# Patient Record
Sex: Female | Born: 1955 | Race: Black or African American | Hispanic: No | Smoking: Never smoker
Health system: Southern US, Community
[De-identification: ages and names within clinical notes are randomized; demographics above are authoritative.]

## PROBLEM LIST (undated history)

## (undated) DIAGNOSIS — R7303 Prediabetes: Secondary | ICD-10-CM

## (undated) DIAGNOSIS — E785 Hyperlipidemia, unspecified: Secondary | ICD-10-CM

## (undated) DIAGNOSIS — I10 Essential (primary) hypertension: Secondary | ICD-10-CM

## (undated) DIAGNOSIS — E119 Type 2 diabetes mellitus without complications: Secondary | ICD-10-CM

## (undated) DIAGNOSIS — J45909 Unspecified asthma, uncomplicated: Secondary | ICD-10-CM

## (undated) HISTORY — DX: Type 2 diabetes mellitus without complications: E11.9

## (undated) HISTORY — DX: Unspecified asthma, uncomplicated: J45.909

## (undated) HISTORY — DX: Hyperlipidemia, unspecified: E78.5

## (undated) HISTORY — PX: HERNIA REPAIR: SHX51

## (undated) HISTORY — PX: CHOLECYSTECTOMY: SHX55

---

## 2004-05-10 ENCOUNTER — Ambulatory Visit (HOSPITAL_COMMUNITY): Admission: RE | Admit: 2004-05-10 | Discharge: 2004-05-10 | Payer: Self-pay | Admitting: Family Medicine

## 2005-12-16 HISTORY — PX: DOPPLER ECHOCARDIOGRAPHY: SHX263

## 2006-05-28 ENCOUNTER — Inpatient Hospital Stay (HOSPITAL_COMMUNITY): Admission: AD | Admit: 2006-05-28 | Discharge: 2006-06-01 | Payer: Self-pay | Admitting: Internal Medicine

## 2006-05-29 ENCOUNTER — Ambulatory Visit: Payer: Self-pay | Admitting: *Deleted

## 2006-05-31 ENCOUNTER — Encounter (INDEPENDENT_AMBULATORY_CARE_PROVIDER_SITE_OTHER): Payer: Self-pay | Admitting: Specialist

## 2007-10-09 ENCOUNTER — Inpatient Hospital Stay (HOSPITAL_COMMUNITY): Admission: EM | Admit: 2007-10-09 | Discharge: 2007-10-10 | Payer: Self-pay | Admitting: Emergency Medicine

## 2008-09-22 IMAGING — CT CT PELVIS W/ CM
1 of 3 series · 14 of 32 positions shown, 19 images · IV contrast (Omnipaque 300)
Comparison: none

HISTORY: Nausea, vomiting, diarrhea, right lower quadrant pain, fever

[Series 2: abd_pel 5.0 b40s · axial · 0.87mm/px · z∈[-466,-50]mm · 14 of 95 slices shown, 19 images]
[im 6/95  soft-tissue]
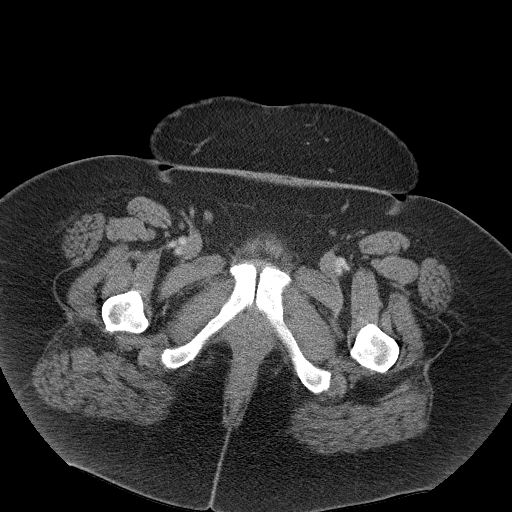
[im 6/95  bone]
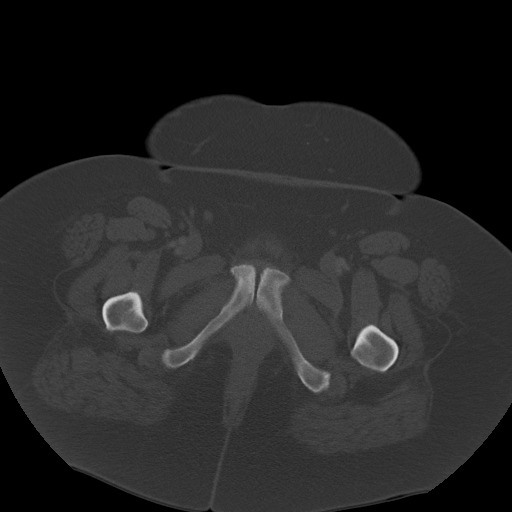
[im 12/95  soft-tissue]
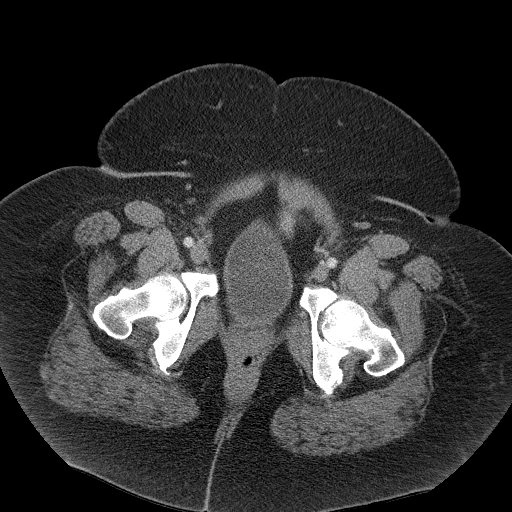
[im 23/95  soft-tissue]
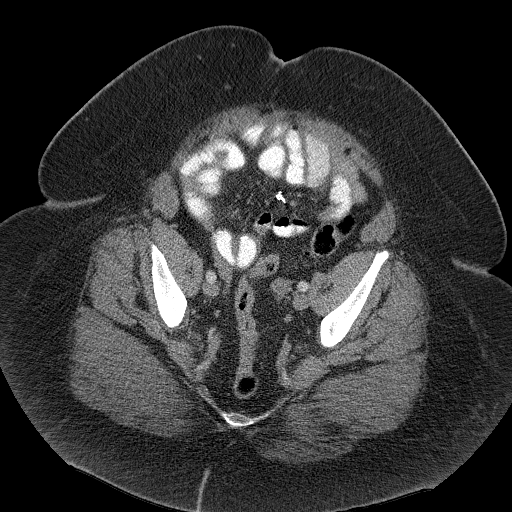
[im 28/95  soft-tissue]
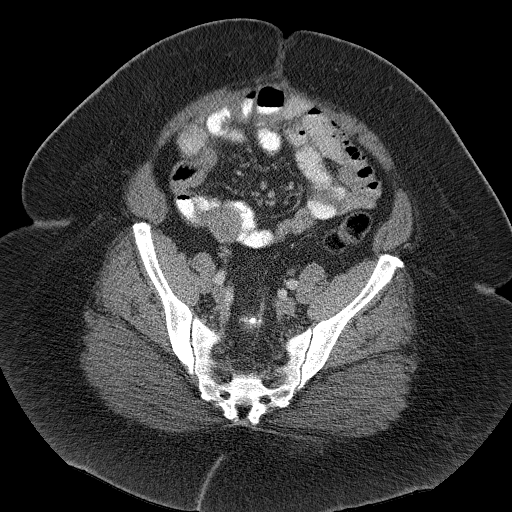
[im 34/95  soft-tissue]
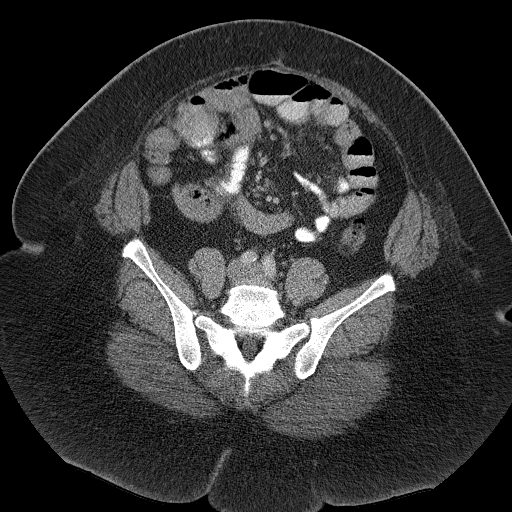
[im 39/95  soft-tissue]
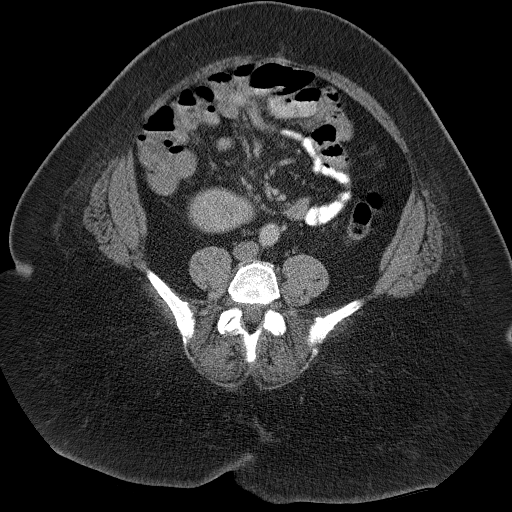
[im 50/95  soft-tissue]
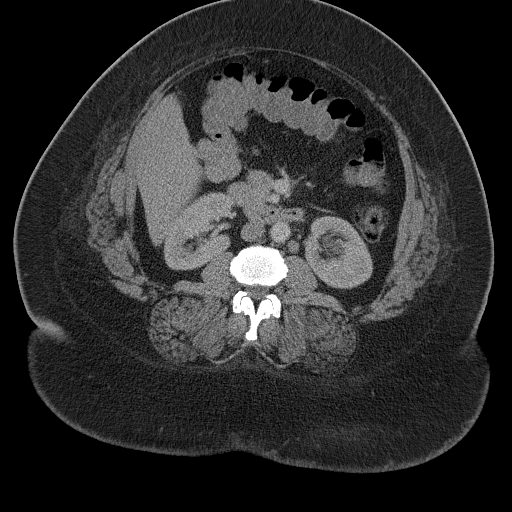
[im 56/95  soft-tissue]
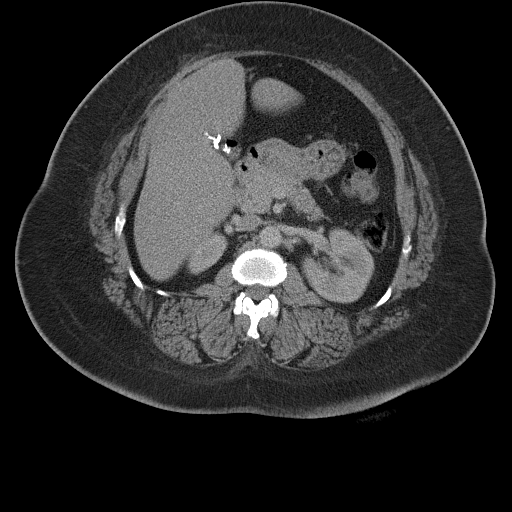
[im 61/95  soft-tissue]
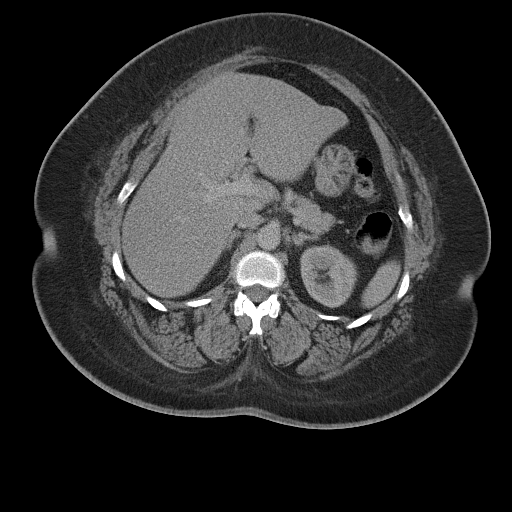
[im 61/95  bone]
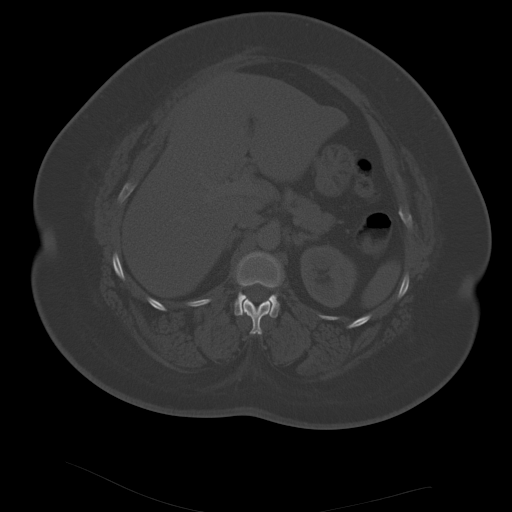
[im 67/95  soft-tissue]
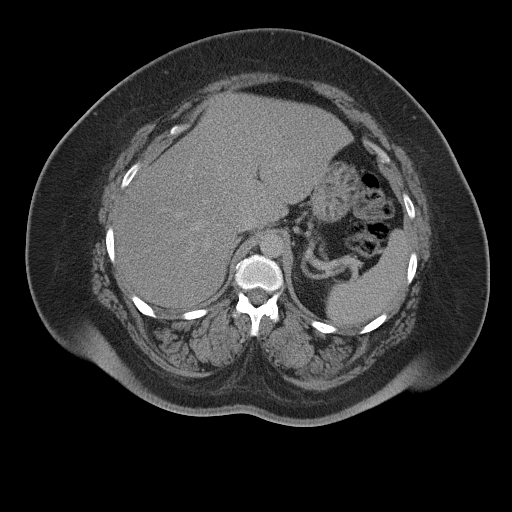
[im 72/95  soft-tissue]
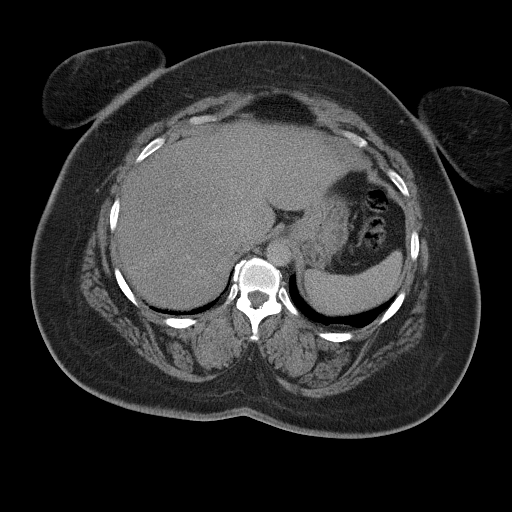
[im 72/95  lung]
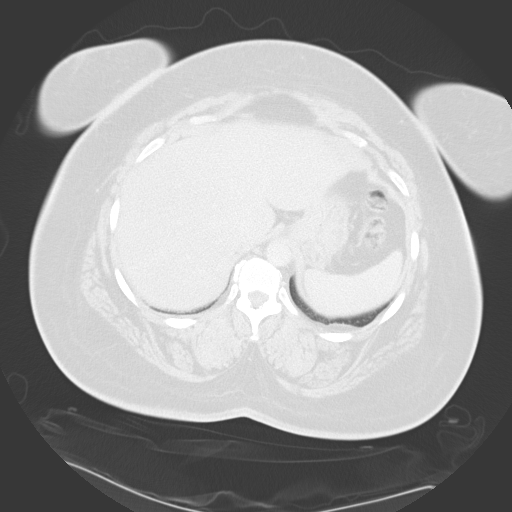
[im 78/95  lung]
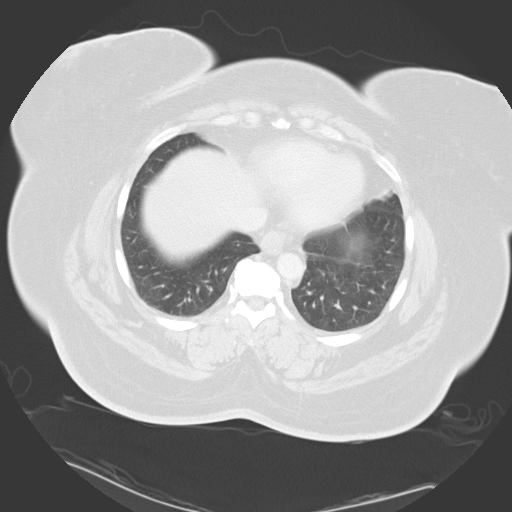
[im 83/95  soft-tissue]
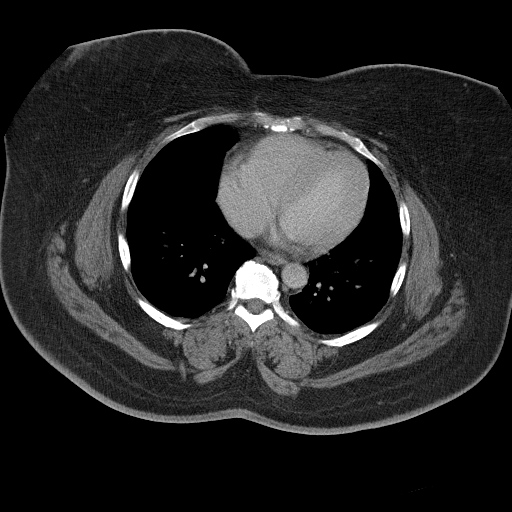
[im 83/95  lung]
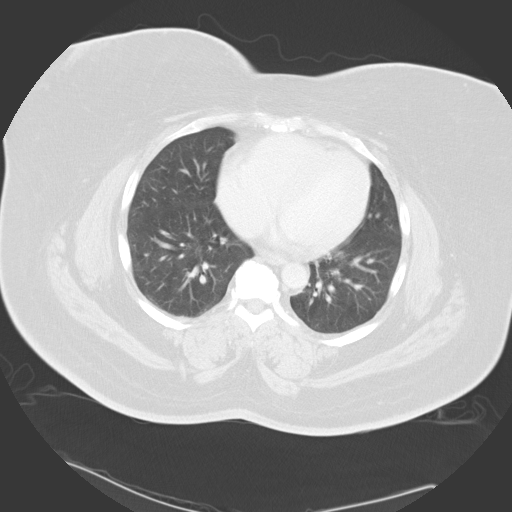
[im 89/95  soft-tissue]
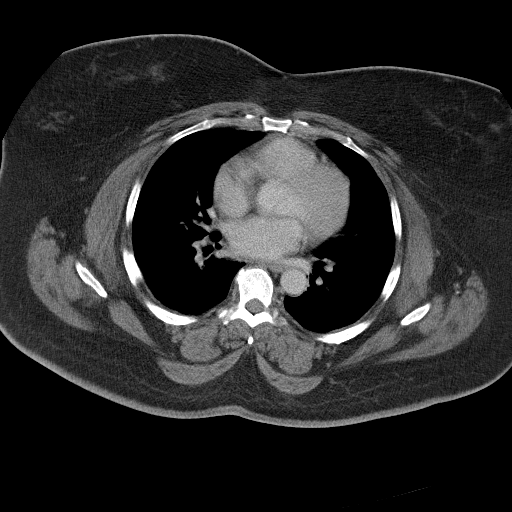
[im 89/95  lung]
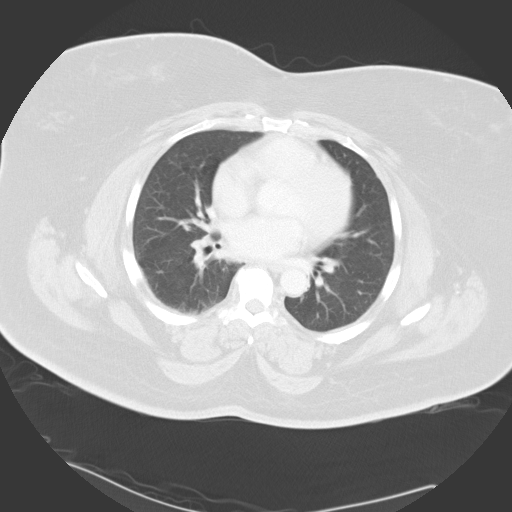

[14 of 32 positions shown; findings below may reference images not displayed]

CT ABDOMEN AND PELVIS WITH CONTRAST:

Multidetector helical CT imaging abdomen and pelvis performed.
Sagittal and coronal images are reconstructed from the axial data set.
Exam utilized dilute oral contrast and 120 cc Vmnipaque-2II.
Comparison CT abdomen 05/30/2006.

CT ABDOMEN:

Lung bases clear.
Tiny hiatal hernia.
Gallbladder surgically absent.
Liver, spleen, pancreas, and adrenal glands normal appearance.
Symmetric nephrograms with small bilateral renal cysts.
No definite hydronephrosis or ureteral dilatation.
Right kidney is slightly malrotated and ptotic.
Abnormal relationship of SMA and SMV suggesting small bowel malrotation.
No definite bowel obstruction identified.
Oral contrast has not progressed into distal small bowel and colon.
IMPRESSION: Suspect small bowel malrotation though no bowel obstruction or upper abdominal
acute process identified.
Small renal cysts.

CT PELVIS:

Normal appearance of uterus and adnexae.
Normal appendix.
Few small bowel loops in the pelvis show questionable minimal bowel wall
thickening, nonspecific.
No inflammatory changes are seen within the pelvis.
No mass, adenopathy, or free fluid.
Colon opacified but otherwise unremarkable.
No bone lesions.
IMPRESSION: Question minimal bowel wall thickening of several small bowel loops in pelvis,
nonspecific, could be seen as an artifact from underdistention, infectious
process/enteritis, inflammatory bowel disease.
No other pelvic abnormalities identified.

## 2009-06-12 ENCOUNTER — Ambulatory Visit (HOSPITAL_COMMUNITY): Admission: RE | Admit: 2009-06-12 | Discharge: 2009-06-12 | Payer: Self-pay | Admitting: Family Medicine

## 2011-02-18 ENCOUNTER — Emergency Department (HOSPITAL_COMMUNITY)
Admission: EM | Admit: 2011-02-18 | Discharge: 2011-02-18 | Disposition: A | Discharge status: Home / Self Care | Payer: Self-pay | Attending: Emergency Medicine | Admitting: Emergency Medicine

## 2011-02-18 ENCOUNTER — Emergency Department (HOSPITAL_COMMUNITY): Payer: Self-pay

## 2011-02-18 DIAGNOSIS — I10 Essential (primary) hypertension: Secondary | ICD-10-CM | POA: Insufficient documentation

## 2011-02-18 DIAGNOSIS — E119 Type 2 diabetes mellitus without complications: Secondary | ICD-10-CM | POA: Insufficient documentation

## 2011-02-18 DIAGNOSIS — R071 Chest pain on breathing: Secondary | ICD-10-CM | POA: Insufficient documentation

## 2011-02-18 DIAGNOSIS — R079 Chest pain, unspecified: Secondary | ICD-10-CM | POA: Insufficient documentation

## 2011-02-18 DIAGNOSIS — R059 Cough, unspecified: Secondary | ICD-10-CM | POA: Insufficient documentation

## 2011-02-18 DIAGNOSIS — R05 Cough: Secondary | ICD-10-CM | POA: Insufficient documentation

## 2011-02-18 LAB — POCT CARDIAC MARKERS
CKMB, poc: 1.1 ng/mL (ref 1.0–8.0)
CKMB, poc: <1 ng/mL — ABNORMAL LOW (ref 1.0–8.0)
Myoglobin, poc: 82.1 ng/mL (ref 12–200)
Myoglobin, poc: 55.9 ng/mL (ref 12–200)
Troponin i, poc: <0.05 ng/mL (ref 0.00–0.09)
Troponin i, poc: <0.05 ng/mL (ref 0.00–0.09)

## 2011-02-18 LAB — CBC
Hemoglobin: 13.3 g/dL (ref 12.0–15.0)
MCH: 32.5 pg (ref 26.0–34.0)
MCHC: 32.8 g/dL (ref 30.0–36.0)
MCV: 99 fL (ref 78.0–100.0)
Platelets: 308 10*3/uL (ref 150–400)

## 2011-02-18 LAB — BASIC METABOLIC PANEL
CO2: 30 mEq/L (ref 19–32)
GFR calc Af Amer: >60 mL/min (ref >60)
GFR calc non Af Amer: >60 mL/min (ref >60)
Sodium: 140 mEq/L (ref 135–145)

## 2011-02-18 LAB — DIFFERENTIAL
Basophils Absolute: 0 10*3/uL (ref 0.0–0.1)
Eosinophils Relative: 2 % (ref 0–5)
Lymphocytes Relative: 27 % (ref 12–46)
Lymphs Abs: 2.8 10*3/uL (ref 0.7–4.0)
Monocytes Absolute: 0.6 10*3/uL (ref 0.1–1.0)
Monocytes Relative: 6 % (ref 3–12)

## 2011-04-30 NOTE — Discharge Summary (Signed)
NAMEJUNETTE, Robin Durham            ACCOUNT NO.:  [id number]   MEDICAL RECORD NO.:  [id number]          PATIENT TYPE:  INP   LOCATION:  A325                          FACILITY:  [facility]   PHYSICIAN:  Marcello Moores, MD   DATE OF BIRTH:  05-Apr-1956   DATE OF ADMISSION:  10/09/2007  DATE OF DISCHARGE:  LH                               DISCHARGE SUMMARY   PRIMARY MEDICAL DOCTOR:  Dr. Nobie Putnam.   DISCHARGE DIAGNOSES:  1. Gastroenteritis, resolved overnight.  2. Hypertension, fairly controlled.  3. Morbid obesity.   HOME MEDICATIONS:  1. She will be discharged with Cipro 500 mg p.o. b.i.d. for 5 days.  2. Ziac 2.5 mg once a day.  3. Potassium chloride 10 mEq once a day.  4. Lasix 20 mg once a day.   HOSPITAL COURSE:  Ms. Soroka is a 55 year old female patient with  morbid obesity.  She has history of hypertension.  She came with  episodes of vomiting and diarrhea, and she was observed over night and  IV fluid given, and she received antibiotic Cipro and Flagyl, and  overnight her diarrhea and vomiting resolved, and the patient is asking  to go home, saying that she feels great, and  there is nothing, the  patient is asymptomatic and she is ready to go home now.   VITAL SIGNS:  Today temperature 97, pulse is 62 and respiratory rate 20.  Blood pressure is 145/82, saturation 96.  HEENT:  Pink conjunctivae, nonicteric sclera.  NECK:  Supple.  CHEST:  Good air entry.  CVS:  S1, S2 regular.  ABDOMEN:  Soft.  Normal bowel sounds.  No area of tenderness.  Obese.  EXTREMITIES:  No edema.  CNS:  Alert and well-oriented.   LABORATORY DATA:  Abdominal CAT scan, which was done yesterday, showed  small bowel malrotation, otherwise there was no other abnormality  identified, and there was a process of nonspecific thickening of several  small bowel loops which suggested enteritis or inflammatory bowel  disease.  Today, her labs white blood cells 9 and hemoglobin 12,  hematocrit 36, and  platelet count is 246.  On the chemistry sodium 142,  potassium 3.6 and chloride 107.  Bicarbonate is 28, glucose is 100 and  the BUN 6, creatinine 0.8.  Liver enzymes:  Total bilirubin is 1.1 and  alkaline phosphatase is 63, AST is 33, ALT is 40.  Urinanalysis showed  white blood cells 10-20, otherwise negative.   DISCHARGE PLAN:  The patient will be discharged today, to have followup  with her PMD in 2-3 days, and she was advised and given antibiotic to  take for 5 days, Cipro, and to come back if there is any problem to  emergency room at any time.  The patient was advised to consider  colonoscopy from her PMD.      Marcello Moores, MD  Electronically Signed     MT/MEDQ  D:  10/10/2007  T:  10/12/2007  Job:  [id number]   cc:   Patrica Duel, M.D.  Fax: [phone]

## 2011-04-30 NOTE — H&P (Signed)
NAMELATAISHA, COLAN            ACCOUNT NO.:  [id number]   MEDICAL RECORD NO.:  [id number]          PATIENT TYPE:  INP   LOCATION:  A325                          FACILITY:  [facility]   PHYSICIAN:  Osvaldo Shipper, MD     DATE OF BIRTH:  1956-10-12   DATE OF ADMISSION:  10/09/2007  DATE OF DISCHARGE:  LH                              HISTORY & PHYSICAL   PRIMARY MEDICAL DOCTOR:  Patrica Duel with [facility].   ADMITTING DIAGNOSES:  1. Abdominal pain likely secondary to gastroenteritis.  2. Morbid obesity.  3. Hypertension.   CHIEF COMPLAINT:  Nausea, vomiting, diarrhea and abdominal  pain since  yesterday.   HISTORY OF PRESENT ILLNESS:  The patient is a 55 year old African-  American female who is morbidly obese and who has a history of  hypertension, who was in her usual state of health, until yesterday when  she started having vomiting, of which she had about 6 episodes.  It was  bilious in color, no blood.  She also had two episodes of diarrhea which  was watery, yellowish, no blood as well.  She also started having  abdominal pain in the right side of the abdomen, which it did not  radiate anywhere but was about 6 to 8/10 in intensity.  No precipitant  factors, no aggravating or relieving factors.  No history of any weight  loss.  No history of any urinary discomfort.  She did mention low-grade  fever at home.  She mentioned she took antibiotics for a UTI more than a  month ago, but she did have some chicken dish at [employer] some time  yesterday.  This was before the onset of her symptoms.  No other travel  history.   MEDICATIONS AT HOME:  1. Ziac 2.5 mg once a day.  2. Potassium chloride 10 mEq once a day.  3. Lasix possibly 20 mg once a day.  She does not know the dose.   ALLERGIES:  NO KNOWN DRUG ALLERGIES.   PAST MEDICAL HISTORY:  Hypertension, leg swelling, obesity.   SURGICAL HISTORY:  Cholecystectomy and hernia repair.  She has never had  a  colonoscopy in the past.   SOCIAL HISTORY:  Lives in [place].  No smoking, alcohol or illicit  drug use.  She is independent with the ADLs.   FAMILY HISTORY:  Positive for hypertension.   REVIEW OF SYSTEMS:  GENERAL:  Positive for weakness.  HEENT:  Unremarkable.  CARDIOVASCULAR:  Unremarkable.  RESPIRATORY:  Unremarkable.  GI:  As in HPI.  GU:  Unremarkable.  MUSCULOSKELETAL:  Unremarkable.  NEUROLOGICAL:  Unremarkable.  PSYCHIATRIC:  Unremarkable.  DERMATOLOGICAL:  Unremarkable.   LABORATORY DATA:  Her hemoglobin is 11.7 with 97% neutrophils, no bands  reported.  Her white count is 11.7 with 91% neutrophils, hemoglobin  13.8, platelet count 284.  Her CMET shows a glucose of 121, bilirubin  1.3, AST 43, ALT 51, lipase 19, calcium 8.7, UA showed a specific  gravity more than 1.03, trace ketones, trace blood, negative nitrite,  leukocytes, 11-20 WBCs, rare bacteria, 3-6 RBCs.   IMAGING STUDIES:  She  had a CAT scan of her abdomen and pelvis with  contrast, which showed suspect small bowel malrotation with no  obstruction, and CT pelvis showed a question of minimal bowel wall  thickening of several small bowel loops and pelvis which is nonspecific.   ASSESSMENT:  This is a 55 year old African-American female, who presents  with vomiting, nausea, a couple episodes of diarrhea and abdominal pain  starting yesterday.  These symptoms started after she ate at [employer].  This is likely food-borne illness.  She possibly has gastroenteritis.  Also, I do not suspect any inflammatory bowel disease at this time.   PLAN:  1. Gastroenteritis, possibly secondary to food-borne pathogen.      Observe in the hospital.  Put her on Cipro and Flagyl.  Keep her      NPO for now and advance as her symptoms improve.  Blood cultures      will be sent.  Stool with C. diff and other investigations will      also be sent, if she has more diarrhea, though the likelihood of      this being C. diff is  pretty low.  2. History of hypertension.  Continue Ziac.  Hold off on the Lasix for      now.  3. Mildly abnormal LFTs.  Just keep a watch on them.  I will re-check      them in the morning.  They could be related to fatty liver.  She is      status post cholecystectomy in the past.   DVT/GI prophylaxis will be initiated.   Anticipate patient improving rather quickly, and then she can probably  go home in a day or two.  If she is still here, and if she has not  improved, GI will be consulted on Monday.  GI is not available this  weekend in this hospital.      Osvaldo Shipper, MD  Electronically Signed     GK/MEDQ  D:  10/09/2007  T:  10/10/2007  Job:  [id number]   cc:   Patrica Duel, M.D.  Fax: [phone]

## 2011-05-03 NOTE — Discharge Summary (Signed)
NAMEMONTANA, BRYNGELSON            ACCOUNT NO.:  [id number]   MEDICAL RECORD NO.:  [id number]          PATIENT TYPE:  INP   LOCATION:  A204                          FACILITY:  [facility]   PHYSICIAN:  Madelin Rear. Sherwood Gambler, MD  DATE OF BIRTH:  17-Dec-1955   DATE OF ADMISSION:  05/28/2006  DATE OF DISCHARGE:  06/17/2007LH                                 DISCHARGE SUMMARY   DISCHARGE DIAGNOSES:  1.  Cholelithiasis and cholecystitis.  2.  Urinary tract infection.  3.  Morbid obesity.  4.  Hypertension.  5.  Gastroesophageal reflux disease and positive Helicobacter pylori      infection.   INPATIENT PROCEDURE:  Cholecystectomy.   MEDICATIONS:  1.  Demodex 10 mg b.i.d. .  2.  Hydralazine 10 mg p.o. daily.   SUMMARY:  Patient was admitted with chest pain waxing and waning for three  days prior to admission.  There was some notable increase in pain with lying  flat.  Her exam was unrevealing on admission.  She was admitted for rule out  of cardiac etiology with serial enzymes.  She was seen in consultation by  cardiology and felt to possibly have a gallbladder mass.  In either case,  she subsequently underwent cholecystectomy by Dr. Katrinka Blazing with no  complications.  Was discharged stable for followup in surgery's office as  well as our office.      Madelin Rear. Sherwood Gambler, MD  Electronically Signed     LJF/MEDQ  D:  06/19/2006  T:  06/19/2006  Job:  [id number]

## 2011-05-03 NOTE — Procedures (Signed)
Robin Durham, Robin Durham            ACCOUNT NO.:  [id number]   MEDICAL RECORD NO.:  [id number]          PATIENT TYPE:  INP   LOCATION:  A204                          FACILITY:  [facility]   PHYSICIAN:  [place] Bing, M.D. [facility] OF BIRTH:  May 12, 1956   DATE OF PROCEDURE:  05/30/2006  DATE OF DISCHARGE:                                  ECHOCARDIOGRAM   PROCEDURE:  Echocardiogram.   CLINICAL DATA:  A 55 year old woman with chest pain.  M-mode aorta 2.9, left  atrium 4.1, septum 1.4, posterior wall 1.6, LV diastole 5.3, LV systole 3.9.   1.  Technically difficult and limited echocardiographic study.  2.  Left atrial size is at the upper limit of normal; right atrial size not      well defined, but is probably normal.  The right ventricle is normal in      size and function.  3.  The aortic root is normal in diameter; calcification of the wall is      present.  The aortic valve is not adequately imaged, but no gross      abnormalities are appreciated.  4.  Poor imaging of the pulmonic valve and proximal pulmonary artery, which      appear normal.  5.  Normal mitral valve; mild to moderate annular calcification.  6.  Tricuspid valve not adequately imaged.  7.  Normal left ventricular size; not all myocardial segments well seen.      Left ventricular hypertrophy appears to be present.  Regional and global      LV function appear hyperdynamic.      [place] Bing, M.D. [facility]  Electronically Signed     RR/MEDQ  D:  05/30/2006  T:  05/30/2006  Job:  [id number]

## 2011-05-03 NOTE — Consult Note (Signed)
Robin Durham, Robin Durham            ACCOUNT NO.:  [id number]   MEDICAL RECORD NO.:  [id number]          PATIENT TYPE:  INP   LOCATION:  A204                          FACILITY:  [facility]   PHYSICIAN:  Vida Roller, M.D.   DATE OF BIRTH:  1956-06-03   DATE OF CONSULTATION:  05/29/2006  DATE OF DISCHARGE:                                   CONSULTATION   CARDIOLOGY CONSULTATION   HISTORY OF PRESENT ILLNESS:  Robin Durham is a 55 year old woman with  past medical history significant for hypertension who presented to her  primary physician's office complaining of chest discomfort.  It is  substernal discomfort in the center of her chest which started on Tuesday  evening when she was lying in bed.  It got worse throughout the night and  into the next morning.  She was admitted to [facility] yesterday  and the discomfort resolved some time after that.  She states that she has  had some nausea and vomiting and no shortness of breath, some hot flushing  sensation. She had recently manipulated her diet, completely off of meat to  do just vegetables and then started to eat meat again after about 21 days,  the day prior to her discomfort.  She denies any paroxysmal nocturnal  dyspnea, orthopnea, no syncope, no palpitations.   PAST MEDICAL HISTORY:  Significant for hypertension, an umbilical hernia  repair, morbid obesity.   CURRENT MEDICATIONS:  1.  Aspirin 325 mg once a day.  2.  Ziac, two pills once a day.  3.  Protonix 40 mg twice a day.  4.  Potassium 20 mEq twice a day.  5.  Demodex 10 mg twice a day.  6.  Catapres 0.2 mg q.i.d. p.r.n. hypertension.  7.  Hydralazine 10 mg intravenously q.4h. p.r.n. hypertension.  8.  Intravenous normal saline at 10 cc per hour.   SOCIAL HISTORY:  She lives in [place] with her children.  She is a widow.  She has two children.  She does not smoke, drink or use illicit drugs.   FAMILY HISTORY:  Her mother died of a brain aneurysm at age 20.   Her father  died of cerebrovascular accident at age 35. She has one brother who died if  a heart attack at age 19 and she lost one brother to pneumonia on Monday.   PHYSICAL EXAMINATION:  VITAL SIGNS:  She is 321 pounds.  Her blood pressure  is 149/91, pulse is 73, respirations 16, she is afebrile.  HEENT:  Examination of the head, ears, eyes, nose and throat is  unremarkable.  NECK:  The neck is full.  There is no obvious jugular venous distention or  carotid bruits.  CHEST:  Her chest has decreased breath sounds at the bases but otherwise is  clear.  CARDIOVASCULAR:  Regular but distant, no obvious murmur is noted.  PMI is  not palpable.  SKIN:  Is without significant rashes.  GENITOURINARY, BREAST, RECTAL:  Examination's are deferred.  ABDOMEN:  Obese but soft.  There is no tenderness.  EXTREMITIES: Have trace edema, 1+ pulses, no bruits.  MUSCULOSKELETAL:  Nonfocal.  NEUROLOGICAL:  Nonfocal.   CLINICAL DATA:  The patient has had a HIDA scan which shows a depressed  gallbladder ejection fraction.  She has had an abdominal ultrasound which  shows a questionable mass in her gallbladder.  She has had abdominal and  chest x-ray which shows constipation and diffuse peribronchial changes but  no acute changes.  An electrocardiogram shows sinus rhythm at a rate of 70  with normal interval, no axes with no ischemic ST-T wave changes.  No Q  waves.  It is a poor quality tracing, however.   LABORATORY DATA:  White blood cell count 14,000.  Hemoglobin and hematocrit  14 and 42.  Platelet count 360,000.  Sodium 143, potassium 4.2, chloride  101, bicarb 32, BUN 14, creatinine 1.0, blood sugar 137, non fasting.  Liver  function tests appear to be relatively normal.  Cardiac enzymes x2 are  negative.  D-dimer is 0.22.  H. Pylori is elevated at 6.0 and she has  evidence of a urinary tract infection on urinalysis.   IMPRESSION:  This is a morbidly obese female with minimal cardiac risk   factors who has atypical chest discomfort with negative cardiac enzymes and  no obvious ischemia on her electrocardiogram after a prolonged episode of  discomfort.  She has what appears to be an abnormal ultrasound with question  of gallbladder either a stone or a mass, it is not clear.  In reviewing the  abdominal ultrasound with the radiologist, he is not convinced it is a big  stone; he thinks it is possible it could be a polyp or some other  abnormality.  She has minimal cardiac risk factors and only really  hypertension which is moderately poorly controlled.  I think it is probably  reasonable to continue to cycle her enzymes.  If she has three sets that are  negative, I would also check her echocardiogram and if that shows  normal  left ventricular systolic function then I would repeat her  electrocardiogram, hopefully this will show no acute ischemia, in which case  I probably would just follow her along and not do anything particularly  provocative as we really don't have any evidence for coronary disease here  and she can complete her work up as an outpatient.  With the recent HIDA  scan, we are unable to do a perfusion scan with technetium for another 24  hours; as such, because she is not having any active issues, I think it is  probably reasonable just to follow her and that is what I would do.      Vida Roller, M.D.  Electronically Signed     JH/MEDQ  D:  05/29/2006  T:  05/29/2006  Job:  [id number]

## 2011-05-03 NOTE — H&P (Signed)
NAMECALIEGH, MIDDLEKAUFF            ACCOUNT NO.:  [id number]   MEDICAL RECORD NO.:  [id number]          PATIENT TYPE:  INP   LOCATION:  A204                          FACILITY:  [facility]   PHYSICIAN:  Madelin Rear. Sherwood Gambler, MD  DATE OF BIRTH:  October 06, 1956   DATE OF ADMISSION:  DATE OF DISCHARGE:  LH                                HISTORY & PHYSICAL   CHIEF COMPLAINT:  Chest pain.   HISTORY OF PRESENT ILLNESS:  The patient has had waxing and waning chest  pain for 3 days pretty much continuously.  She states it does get worse when  she lies recumbent.  She was admitted with some shortness of breath, as  well.  She denies exertional precipitants, although she is uncertain of  this.  She did have a couple of cold sweats and lightheaded spells; however,  she denies any palpitations or true syncope.  There has been no nausea,  vomiting or diarrhea.  No hematemesis, hematochezia or melena.  She denies  true reflux symptomatology.  She had no fevers, rigors or chills.   PAST MEDICAL HISTORY:  1.  Hypertension.  2.  Peripheral edema   MEDICATIONS:  Maintained on:  1.  Demadex.  2.  Potassium.  3.  Ziac.  4.  Lodine p.r.n.   SOCIAL HISTORY:  Nonsmoker, nondrinker.  No drug use.  She is a widow as of  3 years ago and has 4 children.   FAMILY HISTORY:  Positive cerebrovascular accident in her father in his 1s,  and mother deceased around age 39 from a brain aneurysm.  One sibling drug-  eluding stent have coronary artery disease at age 18, namely her brother.   REVIEW OF SYSTEMS:  As above.  She did admit to some increasing pedal edema  associated with some shortness of breath.   PHYSICAL EXAMINATION:  GENERAL:  Morbidly obese, in no acute distress.  HEENT:  No JVD or adenopathy.  NECK:  Supple.  CHEST:  Clear.  CARDIAC:  Regular rhythm.  No murmur, gallop, or rub.  ABDOMEN:  Soft.  No organomegaly or masses.  EXTREMITIES:  Trace bipedal edema.  NEUROLOGIC:  Nonfocal.   Electrocardiogram in the office revealed AC current artifact; however, there  is slight flattening of V5 and V6, as well as in lead I and II.  Overall,  these are nonspecific ST-T wave changes without any acute ischemia or  infarction identified.   IMPRESSION:  Chest pain, uncertain etiology.  Clearly cardiac etiology is  the most worrisome.  She will be admitted for telemetry monitoring, stat and  serial enzymes.  Her blood pressure is also moderately elevated.  It has  been running up a little bit to 179-180 systolic/95 roughly.  In the office,  she was 150/98.  Due to this hypertension, we will increase her  antihypertensive regimen.  We will observe and use short-acting medications  as needed to maintain good blood pressure control.  Will start her on proton  pump inhibitor therapy, as well as oral aspirin.  Cardiology consultation  will be sought.  At the time of transfer to the  hospital, she is stable.      Madelin Rear. Sherwood Gambler, MD  Electronically Signed     LJF/MEDQ  D:  05/28/2006  T:  05/28/2006  Job:  [id number]

## 2011-05-03 NOTE — Op Note (Signed)
Robin Durham, Robin Durham            ACCOUNT NO.:  [id number]   MEDICAL RECORD NO.:  [id number]          PATIENT TYPE:  INP   LOCATION:  A204                          FACILITY:  [facility]   PHYSICIAN:  Dirk Dress. Katrinka Blazing, M.D.   DATE OF BIRTH:  08-May-1956   DATE OF PROCEDURE:  DATE OF DISCHARGE:                                 OPERATIVE REPORT   PREOPERATIVE DIAGNOSIS:  Cholecystitis with gallbladder mass.   POSTOPERATIVE DIAGNOSIS:  Cholecystitis, cholelithiasis.   PROCEDURE:  Open cholecystectomy.   SURGEON:  Dirk Dress. Katrinka Blazing, M.D.   INDICATIONS:  The patient is a 55 year old female who presented with chest  and upper abdominal pain. She was evaluated for cardiac disease and this was  ruled out.  She was found to have a large mass of her gallbladder which had  some increased vascular flow suggesting a soft tissue mass of the  gallbladder.  The mass was about 4 cm.  The patient is still symptomatic and  she is scheduled for open cholecystectomy, more because of the fact that she  has had intestinal resection and hernia repair and the upper abdomen is  filled with adhesions.   DESCRIPTION OF PROCEDURE:  Under general anesthesia the patient's abdomen  was prepped and draped in the sterile field.  Right subcostal incision was  made and extended through the muscle into the peritoneal cavity.  The upper  abdomen was explored.  The gallbladder was intensely dilated and the wall  was slightly thickened, however, the mass appeared to be a very large hard  stone that filled the lower one third of the gallbladder. After the abdomen  was adequately packed off, it was separated from the intrahepatic band with  electrocautery.  Vessels were isolated, clipped with hemoclips and divided.  Dissection was continued down to the cystic duct.  The cystic duct was  clamped with right angle clamp close to the gallbladder and divided.  Gallbladder was passed off.  The mass appeared to be a large stone.  The  cystic duct was tied with two ligatures of 2-0 silk and then doubly clipped.  Irrigation was carried out.  No other abnormality was not noted.  The bed of  the gallbladder appeared to be normal.  There was no induration in this  area.  Sponge counts were verified as correct x2. The abdomen was closed  using 0  Prolene on both anterior and posterior rectus sheaths, 2-0 Monocryl in the  subcutaneous fat and staples on the skin.  Dressings were placed.  She was  awakened from anesthesia uneventfully, transferred to a bed and taken to the  postanesthesia care unit in satisfactory condition.      Dirk Dress. Katrinka Blazing, M.D.  Electronically Signed     LCS/MEDQ  D:  05/31/2006  T:  05/31/2006  Job:  [id number]   cc:   Madelin Rear. Sherwood Gambler, MD  Fax: [phone]

## 2011-09-25 LAB — HEPATIC FUNCTION PANEL
ALT: 51 — ABNORMAL HIGH
AST: 43 — ABNORMAL HIGH
Albumin: 3.7
Total Protein: 6.8

## 2011-09-25 LAB — BASIC METABOLIC PANEL
CO2: 27
Chloride: 103
GFR calc Af Amer: >60
Potassium: 3.6
Sodium: 135

## 2011-09-25 LAB — COMPREHENSIVE METABOLIC PANEL
ALT: 43 — ABNORMAL HIGH
Albumin: 3.2 — ABNORMAL LOW
Alkaline Phosphatase: 63
Potassium: 3.6
Sodium: 142
Total Protein: 5.9 — ABNORMAL LOW

## 2011-09-25 LAB — DIFFERENTIAL
Basophils Absolute: 0
Basophils Relative: 0
Basophils Relative: 0
Eosinophils Absolute: 0.1
Lymphocytes Relative: 6 — ABNORMAL LOW
Lymphs Abs: 0.7
Monocytes Absolute: 0.7
Monocytes Relative: 8
Neutro Abs: 6.7
Neutrophils Relative %: 91 — ABNORMAL HIGH

## 2011-09-25 LAB — URINALYSIS, ROUTINE W REFLEX MICROSCOPIC
Glucose, UA: 100 — AB
Protein, ur: >300 — AB
pH: 5.5

## 2011-09-25 LAB — CBC
HCT: 36
HCT: 41.3
Hemoglobin: 12
MCHC: 33.3
MCV: 99.1
Platelets: 246
RBC: 3.61 — ABNORMAL LOW
RBC: 4.17
WBC: 11.7 — ABNORMAL HIGH
WBC: 9

## 2011-09-25 LAB — CULTURE, BLOOD (ROUTINE X 2)

## 2011-09-25 LAB — URINE MICROSCOPIC-ADD ON

## 2011-09-25 LAB — LIPASE, BLOOD: Lipase: 19

## 2012-02-15 ENCOUNTER — Other Ambulatory Visit (HOSPITAL_COMMUNITY): Payer: Self-pay | Admitting: Family Medicine

## 2012-02-15 ENCOUNTER — Ambulatory Visit (HOSPITAL_COMMUNITY)
Admission: RE | Admit: 2012-02-15 | Discharge: 2012-02-15 | Disposition: A | Discharge status: Home / Self Care | Payer: Self-pay | Source: Ambulatory Visit | Attending: Family Medicine | Admitting: Family Medicine

## 2012-02-15 DIAGNOSIS — R0989 Other specified symptoms and signs involving the circulatory and respiratory systems: Secondary | ICD-10-CM | POA: Insufficient documentation

## 2012-02-15 DIAGNOSIS — I517 Cardiomegaly: Secondary | ICD-10-CM | POA: Insufficient documentation

## 2012-02-15 DIAGNOSIS — R059 Cough, unspecified: Secondary | ICD-10-CM | POA: Insufficient documentation

## 2012-02-15 DIAGNOSIS — R05 Cough: Secondary | ICD-10-CM | POA: Insufficient documentation

## 2012-02-27 ENCOUNTER — Ambulatory Visit (HOSPITAL_COMMUNITY)
Admission: RE | Admit: 2012-02-27 | Discharge: 2012-02-27 | Disposition: A | Discharge status: Home / Self Care | Payer: Self-pay | Source: Ambulatory Visit | Attending: Cardiovascular Disease | Admitting: Cardiovascular Disease

## 2012-02-27 DIAGNOSIS — I10 Essential (primary) hypertension: Secondary | ICD-10-CM | POA: Insufficient documentation

## 2012-02-27 DIAGNOSIS — I517 Cardiomegaly: Secondary | ICD-10-CM | POA: Insufficient documentation

## 2012-02-27 DIAGNOSIS — Z8249 Family history of ischemic heart disease and other diseases of the circulatory system: Secondary | ICD-10-CM | POA: Insufficient documentation

## 2012-02-27 HISTORY — PX: DOPPLER ECHOCARDIOGRAPHY: SHX263

## 2012-02-27 NOTE — Progress Notes (Signed)
*  PRELIMINARY RESULTS* Echocardiogram 2D Echocardiogram has been performed.  Conrad  02/27/2012, 1:17 PM

## 2012-10-12 ENCOUNTER — Emergency Department (HOSPITAL_COMMUNITY): Payer: Self-pay

## 2012-10-12 ENCOUNTER — Encounter (HOSPITAL_COMMUNITY): Payer: Self-pay | Admitting: *Deleted

## 2012-10-12 ENCOUNTER — Emergency Department (HOSPITAL_COMMUNITY)
Admission: EM | Admit: 2012-10-12 | Discharge: 2012-10-12 | Disposition: A | Discharge status: Home / Self Care | Payer: Self-pay | Attending: Emergency Medicine | Admitting: Emergency Medicine

## 2012-10-12 DIAGNOSIS — I10 Essential (primary) hypertension: Secondary | ICD-10-CM | POA: Insufficient documentation

## 2012-10-12 DIAGNOSIS — Z79899 Other long term (current) drug therapy: Secondary | ICD-10-CM | POA: Insufficient documentation

## 2012-10-12 DIAGNOSIS — J029 Acute pharyngitis, unspecified: Secondary | ICD-10-CM | POA: Insufficient documentation

## 2012-10-12 DIAGNOSIS — R05 Cough: Secondary | ICD-10-CM | POA: Insufficient documentation

## 2012-10-12 DIAGNOSIS — J069 Acute upper respiratory infection, unspecified: Secondary | ICD-10-CM | POA: Insufficient documentation

## 2012-10-12 DIAGNOSIS — R059 Cough, unspecified: Secondary | ICD-10-CM | POA: Insufficient documentation

## 2012-10-12 HISTORY — DX: Prediabetes: R73.03

## 2012-10-12 HISTORY — DX: Essential (primary) hypertension: I10

## 2012-10-12 LAB — GLUCOSE, CAPILLARY: Glucose-Capillary: 99 mg/dL (ref 70–99)

## 2012-10-12 MED ORDER — OXYCODONE-ACETAMINOPHEN 5-325 MG PO TABS: 1.0000 | ORAL_TABLET | ORAL | Status: AC | PRN

## 2012-10-12 MED ORDER — DEXAMETHASONE 10 MG/ML FOR PEDIATRIC ORAL USE
10.0000 mg | Freq: Once | INTRAMUSCULAR | Status: AC
  Administered 2012-10-12: 10 mg via ORAL
  Filled 2012-10-12: qty 1

## 2012-10-12 NOTE — ED Provider Notes (Signed)
History   This chart was scribed for Robin Booze, MD by Gerlean Ren. This patient was seen in room [facility]16A/[facility]16A and the patient's care was started at 14:29.   CSN: [id number]  Arrival date & time 10/12/12  1304   First MD Initiated Contact with Patient 10/12/12 1427      Chief Complaint  Patient presents with  . Otalgia    (Consider location/radiation/quality/duration/timing/severity/associated sxs/prior treatment) The history is provided by the patient. No language interpreter was used.   [name] is a 56 y.o. female who presents to the Emergency Department complaining of 3 days of constant, gradually worsening bilateral otalgia worsened by swallowing with associated sore throat, HA, green phlegm-producing cough, chills, sweats, clear rhinorrhea, and myalgias.  Pain is mildly improved by tylenol.  Pt denies fever and any known sick contacts.  Pt has not received flu-shot this year.  Pt has h/o HTN and borderline DM.  Pt denies tobacco and alcohol use.  PCP is Dr. Phillips Odor at [facility] Past Medical History  Diagnosis Date  . Hypertension   . Borderline diabetic     Past Surgical History  Procedure Date  . Hernia repair   . Cholecystectomy     History reviewed. No pertinent family history.  History  Substance Use Topics  . Smoking status: Never Smoker   . Smokeless tobacco: Not on file  . Alcohol Use: No    No OB history provided.  Review of Systems  All other systems reviewed and are negative.    Allergies  Review of patient's allergies indicates no known allergies.  Home Medications   Current Outpatient Rx  Name Route Sig Dispense Refill  . ACETAMINOPHEN 500 MG PO TABS Oral Take 1,000 mg by mouth every 6 (six) hours as needed. Pain    . AMLODIPINE BESYLATE 5 MG PO TABS Oral Take 5 mg by mouth daily.    Marland Kitchen LISINOPRIL 20 MG PO TABS Oral Take 20 mg by mouth daily.    Marland Kitchen METFORMIN HCL 500 MG PO TABS Oral Take 500 mg by mouth daily.    Marland Kitchen POTASSIUM  CHLORIDE ER 10 MEQ PO TBCR Oral Take 10 mEq by mouth daily.    . TORSEMIDE 20 MG PO TABS Oral Take 20 mg by mouth daily.      BP 169/81  Pulse 80  Temp 98 F (36.7 C) (Oral)  Resp 20  Ht 5\' 3"  (1.6 m)  Wt 324 lb (146.965 kg)  BMI 57.39 kg/m2  SpO2 100%  Physical Exam  Nursing note and vitals reviewed. Constitutional: She is oriented to person, place, and time. She appears well-developed and well-nourished.       Obese.  HENT:  Head: Normocephalic and atraumatic.       Mild erythema of the pharynx.  Mild edema of the uvula.    Eyes: EOM are normal.  Neck: Normal range of motion. No tracheal deviation present.  Cardiovascular: Normal rate, regular rhythm and normal heart sounds.   No murmur heard. Pulmonary/Chest: Effort normal and breath sounds normal. She has no wheezes.  Abdominal: Soft. Bowel sounds are normal. There is no tenderness.  Musculoskeletal: Normal range of motion. She exhibits no edema.  Neurological: She is alert and oriented to person, place, and time.  Skin: Skin is warm.  Psychiatric: She has a normal mood and affect.    ED Course  Procedures (including critical care time) DIAGNOSTIC STUDIES: Oxygen Saturation is 100% on room air, normal by my interpretation.  COORDINATION OF CARE: 14:35- Patient informed of clinical course, understands medical decision-making process, and agrees with plan.  Ordered CBG monitoring, rapid strep screen, and chest XR.     Results for orders placed during the hospital encounter of 10/12/12  RAPID STREP SCREEN      Component Value Range   Streptococcus, Group A Screen (Direct) NEGATIVE  NEGATIVE  GLUCOSE, CAPILLARY      Component Value Range   Glucose-Capillary 99  70 - 99 mg/dL   Dg Chest 2 View  40/98/1191  *RADIOLOGY REPORT*  Clinical Data: Productive cough, sore throat and otalgia.  CHEST - 2 VIEW  Comparison: 02/15/2012  Findings: Slightly more prominent cardiomegaly without evidence of overt pulmonary edema,  airspace consolidation or pleural fluid. Stable tortuosity of the thoracic aorta.  IMPRESSION: Slightly more prominent cardiomegaly since the prior chest x-ray. No overt edema or focal infiltrate is identified.   Original Report Authenticated By: Reola Calkins, M.D.       1. Upper respiratory infection   2. Pharyngitis       MDM  Upper respiratory infection with pharyngitis. Her pain appears to be referred. She is diabetic, so CBG will be checked to see if she would be able to tolerate a steroid dose.  Strep screen is negative and chest x-ray shows no evidence of pneumonia. CBG is actually normal at 99. She's given a dose of dexamethasone and sent home with prescription for Percocet for pain. She is to followup with her PCP if she is not starting to show improvement in the next several days.  I personally performed the services described in this documentation, which was scribed in my presence. The recorded information has been reviewed and considered.           Robin Booze, MD 10/12/12 [phone]

## 2012-10-12 NOTE — ED Notes (Signed)
bil earache, sore throat, and headache since Saturday.

## 2014-03-22 ENCOUNTER — Other Ambulatory Visit (HOSPITAL_COMMUNITY): Payer: Self-pay | Admitting: Family Medicine

## 2014-03-22 ENCOUNTER — Ambulatory Visit (HOSPITAL_COMMUNITY)
Admission: RE | Admit: 2014-03-22 | Discharge: 2014-03-22 | Disposition: A | Discharge status: Home / Self Care | Payer: BC Managed Care – PPO | Source: Ambulatory Visit | Attending: Family Medicine | Admitting: Family Medicine

## 2014-03-22 DIAGNOSIS — R0602 Shortness of breath: Secondary | ICD-10-CM | POA: Insufficient documentation

## 2014-03-22 DIAGNOSIS — R059 Cough, unspecified: Secondary | ICD-10-CM

## 2014-03-22 DIAGNOSIS — R918 Other nonspecific abnormal finding of lung field: Secondary | ICD-10-CM | POA: Insufficient documentation

## 2014-03-22 DIAGNOSIS — R05 Cough: Secondary | ICD-10-CM

## 2014-03-22 DIAGNOSIS — J209 Acute bronchitis, unspecified: Secondary | ICD-10-CM

## 2014-03-22 DIAGNOSIS — R062 Wheezing: Secondary | ICD-10-CM | POA: Insufficient documentation

## 2014-03-22 DIAGNOSIS — R0989 Other specified symptoms and signs involving the circulatory and respiratory systems: Secondary | ICD-10-CM | POA: Insufficient documentation

## 2015-01-12 ENCOUNTER — Telehealth: Payer: Self-pay | Admitting: Internal Medicine

## 2015-01-12 NOTE — Telephone Encounter (Signed)
[facility]Dr Assunta Found[facility] Golding) for appointment with Dr Rennis GoldenHilty on 01/31/15.  [facility] Dr Blanchie DessertHilty's schedule on 01/31/15.  lp

## 2015-01-13 NOTE — Telephone Encounter (Signed)
Close encounter 

## 2015-01-23 ENCOUNTER — Encounter: Payer: Self-pay | Admitting: *Deleted

## 2015-01-31 ENCOUNTER — Ambulatory Visit (INDEPENDENT_AMBULATORY_CARE_PROVIDER_SITE_OTHER): Payer: 59 | Admitting: Internal Medicine

## 2015-01-31 ENCOUNTER — Encounter: Payer: Self-pay | Admitting: Internal Medicine

## 2015-01-31 VITALS — BP 142/90 | HR 79 | Ht 63.0 in | Wt 315.5 lb

## 2015-01-31 DIAGNOSIS — I519 Heart disease, unspecified: Secondary | ICD-10-CM

## 2015-01-31 DIAGNOSIS — R0789 Other chest pain: Secondary | ICD-10-CM

## 2015-01-31 DIAGNOSIS — I5189 Other ill-defined heart diseases: Secondary | ICD-10-CM | POA: Insufficient documentation

## 2015-01-31 DIAGNOSIS — I517 Cardiomegaly: Secondary | ICD-10-CM

## 2015-01-31 DIAGNOSIS — I10 Essential (primary) hypertension: Secondary | ICD-10-CM | POA: Insufficient documentation

## 2015-01-31 NOTE — Patient Instructions (Signed)
Your physician wants you to follow-up in: 1 year with Dr. Hilty. You will receive a reminder letter in the mail two months in advance. If you don't receive a letter, please call our office to schedule the follow-up appointment.  

## 2015-01-31 NOTE — Progress Notes (Signed)
OFFICE NOTE  Chief Complaint:  Chest pain  Primary Care Physician: Colette Ribas, MD  HPI:  Robin Durham  Is a pleasant 59 year old female previously seen by Dr. Alanda Amass in 2013 for cardiomegaly. She does have a history of hypertension, dyslipidemia, and obesity  As well as diabetes. She had undergone stress testing which was negative for ischemia in the past. She also had an echocardiogram in 2013 which shows moderate LVH, grade 2 diastolic dysfunction , mild to moderate mitral annular calcification with mild regurgitation and mild left atrial enlargement.  She recently been describing some left upper chest and shoulder discomfort. She says it feels that her soreness in her chest that it's worse when pushing on. She describes it as a 1 or 2 out of 10 and it is constant , in fact it is been constant for about 3 months. Is not improved improved with rest or worsened with exertion.  She does have a history of a left shoulder problem and wonders if it could be related.  PMHx:  Past Medical History  Diagnosis Date  . Hypertension   . Borderline diabetic   . Diabetes mellitus without complication   . Hyperlipidemia     Past Surgical History  Procedure Laterality Date  . Hernia repair    . Cholecystectomy    . Doppler echocardiography  2007    showed normal LV function and normal LV size  . Doppler echocardiography  02/27/2012    LV EF 55-60%PATTERN OF MODERATE LVH, MODERATE CONCENTRIC HYPERTROPHY; MITRAL VALVE MILDLY TO MODERATELY CALCIFIED ANNULUS- MILD REGURGITATION,LEFT ATRIUM MILDLY DILATED    FAMHx:  Family History  Problem Relation Age of Onset  . Aneurysm Mother   . Heart attack Father   . Heart disease Other   . Stroke Other     SOCHx:   reports that she has never smoked. She does not have any smokeless tobacco history on file. She reports that she does not drink alcohol or use illicit drugs.  ALLERGIES:  No Known Allergies  ROS: A comprehensive  review of systems was negative except for: Cardiovascular: positive for chest pain  HOME MEDS: Current Outpatient Prescriptions  Medication Sig Dispense Refill  . acetaminophen (TYLENOL) 500 MG tablet Take 1,000 mg by mouth every 6 (six) hours as needed. Pain    . amLODipine (NORVASC) 5 MG tablet Take 5 mg by mouth daily.    . hydrochlorothiazide (MICROZIDE) 12.5 MG capsule Take 12.5 mg by mouth daily.    Marland Kitchen lisinopril (PRINIVIL,ZESTRIL) 20 MG tablet Take 20 mg by mouth daily.    . metFORMIN (GLUCOPHAGE) 500 MG tablet Take 500 mg by mouth daily.    . potassium chloride (K-DUR) 10 MEQ tablet Take 10 mEq by mouth daily.    . pravastatin (PRAVACHOL) 40 MG tablet Take 40 mg by mouth daily.    Marland Kitchen torsemide (DEMADEX) 20 MG tablet Take 20 mg by mouth daily.     No current facility-administered medications for this visit.    LABS/IMAGING: No results found for this or any previous visit (from the past 48 hour(s)). No results found.  VITALS: BP 142/90 mmHg  Pulse 79  Ht  (1.6 m)  Wt 315 lb 8 oz (143.11 kg)  BMI 55.90 kg/m2  EXAM: General appearance: alert and no distress Neck: no carotid bruit and Thick neck, unable to assess JVP Lungs: diminished breath sounds bilaterally Heart: regular rate and rhythm, S1, S2 normal, no murmur, click, rub or gallop  Abdomen: soft, non-tender; bowel sounds normal; no masses,  no organomegaly and Morbidly obese Extremities: extremities normal, atraumatic, no cyanosis or edema Pulses: 2+ and symmetric Skin: Skin color, texture, turgor normal. No rashes or lesions Neurologic: Grossly normal Psych: Pleasant  EKG: Normal sinus rhythm at 79, moderate voltage criteria for LVH  ASSESSMENT: 1. Atypical chest wall pain 2. Morbid obesity 3. Moderate LVH with Stage 2 diastolic dysfunction and left atrial enlargement, preserved LVEF 55% 4. Hypertension 5. Dyslipidemia 6. DM2  PLAN: 1.   She is describing chest wall pain, which is constant, not worse  with exertion or relieved by rest.  Stress testing is not indicated. Consider a short-course of anti-inflammatories. This could also be a thoracic radiculopathy due to her weight. She should have follow-up with regards to her hypertensive heart disease. I would be happy to see her annually.  Thanks for the kind referral.  Chrystie NoseKenneth C. Hilty, MD, [facility] Attending Cardiologist [facility]  HILTY,Kenneth C 01/31/2015, 3:58 PM

## 2016-01-31 ENCOUNTER — Ambulatory Visit (INDEPENDENT_AMBULATORY_CARE_PROVIDER_SITE_OTHER): Payer: PRIVATE HEALTH INSURANCE | Admitting: Internal Medicine

## 2016-01-31 ENCOUNTER — Encounter: Payer: Self-pay | Admitting: Internal Medicine

## 2016-01-31 VITALS — BP 156/92 | HR 78 | Ht 63.0 in | Wt 316.9 lb

## 2016-01-31 DIAGNOSIS — I5189 Other ill-defined heart diseases: Secondary | ICD-10-CM

## 2016-01-31 DIAGNOSIS — R079 Chest pain, unspecified: Secondary | ICD-10-CM | POA: Diagnosis not present

## 2016-01-31 DIAGNOSIS — I519 Heart disease, unspecified: Secondary | ICD-10-CM

## 2016-01-31 DIAGNOSIS — I10 Essential (primary) hypertension: Secondary | ICD-10-CM | POA: Diagnosis not present

## 2016-01-31 DIAGNOSIS — I517 Cardiomegaly: Secondary | ICD-10-CM

## 2016-01-31 DIAGNOSIS — R0789 Other chest pain: Secondary | ICD-10-CM

## 2016-01-31 NOTE — Patient Instructions (Signed)
Dr. Rennis Golden recommends Delsym or Robutussin over the counter for cough.  Please follow up with PCP  Your physician has requested that you have a lexiscan myoview - 2 day protocol. For further information please visit [email or web address] Please follow instruction sheet, as given.  Your physician recommends that you schedule a follow-up appointment after your stress test with Dr. Rennis Golden

## 2016-01-31 NOTE — Progress Notes (Signed)
OFFICE NOTE  Chief Complaint:  Chest pain  Primary Care Physician: Colette Ribas, MD  HPI:  Robin Durham  Is a pleasant 60 year old female previously seen by Dr. Alanda Amass in 2013 for cardiomegaly. She does have a history of hypertension, dyslipidemia, and obesity  As well as diabetes. She had undergone stress testing which was negative for ischemia in the past. She also had an echocardiogram in 2013 which shows moderate LVH, grade 2 diastolic dysfunction , mild to moderate mitral annular calcification with mild regurgitation and mild left atrial enlargement.  She recently been describing some left upper chest and shoulder discomfort. She says it feels that her soreness in her chest that it's worse when pushing on. She describes it as a 1 or 2 out of 10 and it is constant , in fact it is been constant for about 3 months. Is not improved improved with rest or worsened with exertion.  She does have a history of a left shoulder problem and wonders if it could be related.  Robin Durham returns today for follow-up. She continues to have left-sided chest pain. She's had stress testing in the past which was negative for ischemia but it's been a number of years ago. Some of her symptoms sounded atypical and recommended nonsteroidals however that does not help. She's been more short of breath recently. Also she has an upper respiratory infection for the past several days. Echo as mentioned ready showed some grade 2 diastolic dysfunction and her blood pressure is not at goal.  PMHx:  Past Medical History  Diagnosis Date  . Hypertension   . Borderline diabetic   . Diabetes mellitus without complication (HCC)   . Hyperlipidemia     Past Surgical History  Procedure Laterality Date  . Hernia repair    . Cholecystectomy    . Doppler echocardiography  2007    showed normal LV function and normal LV size  . Doppler echocardiography  02/27/2012    LV EF 55-60%PATTERN OF MODERATE LVH,  MODERATE CONCENTRIC HYPERTROPHY; MITRAL VALVE MILDLY TO MODERATELY CALCIFIED ANNULUS- MILD REGURGITATION,LEFT ATRIUM MILDLY DILATED    FAMHx:  Family History  Problem Relation Age of Onset  . Aneurysm Mother   . Heart attack Father   . Heart disease Other   . Stroke Other     SOCHx:   reports that she has never smoked. She does not have any smokeless tobacco history on file. She reports that she does not drink alcohol or use illicit drugs.  ALLERGIES:  No Known Allergies  ROS: A comprehensive review of systems was negative except for: Cardiovascular: positive for chest pain  HOME MEDS: Current Outpatient Prescriptions  Medication Sig Dispense Refill  . acetaminophen (TYLENOL) 500 MG tablet Take 1,000 mg by mouth every 6 (six) hours as needed. Pain    . amLODipine (NORVASC) 5 MG tablet Take 5 mg by mouth daily.    . hydrochlorothiazide (MICROZIDE) 12.5 MG capsule Take 12.5 mg by mouth daily.    Marland Kitchen lisinopril (PRINIVIL,ZESTRIL) 20 MG tablet Take 20 mg by mouth daily.    Marland Kitchen loratadine (CLARITIN) 10 MG tablet Take 10 mg by mouth daily.    . metFORMIN (GLUCOPHAGE) 500 MG tablet Take 500 mg by mouth daily.    . potassium chloride (K-DUR) 10 MEQ tablet Take 10 mEq by mouth daily.    . pravastatin (PRAVACHOL) 40 MG tablet Take 40 mg by mouth daily.    Marland Kitchen torsemide (DEMADEX) 20 MG tablet Take  20 mg by mouth as needed.      No current facility-administered medications for this visit.    LABS/IMAGING: No results found for this or any previous visit (from the past 48 hour(s)). No results found.  VITALS: BP 156/92 mmHg  Pulse 78  Ht  (1.6 m)  Wt 316 lb 14.4 oz (143.745 kg)  BMI 56.15 kg/m2  EXAM: General appearance: alert and no distress Neck: no carotid bruit and Thick neck, unable to assess JVP Lungs: diminished breath sounds bilaterally Heart: regular rate and rhythm, S1, S2 normal, no murmur, click, rub or gallop Abdomen: soft, non-tender; bowel sounds normal; no masses,   no organomegaly and Morbidly obese Extremities: extremities normal, atraumatic, no cyanosis or edema Pulses: 2+ and symmetric Skin: Skin color, texture, turgor normal. No rashes or lesions Neurologic: Grossly normal Psych: Pleasant  EKG: Normal sinus rhythm at 78, moderate voltage criteria for LVH Nonspecific T-wave changes which are new  ASSESSMENT: 1. Recurrent/progressive chest pain 2. Morbid obesity 3. Moderate LVH with Stage 2 diastolic dysfunction and left atrial enlargement, preserved LVEF 55% 4. Hypertension 5. Dyslipidemia 6. DM2  PLAN: 1.   Mrs. Frisbie is describing more chest discomfort. I think this is related to weight and probably uncontrolled hypertension. She short of breath today but this is related to upper respiratory infection. She does have some new mild EKG changes including T-wave inversions laterally. Based on these findings and recurrent chest pain symptoms I like her to undergo another stress test. She'll need a 2 day study given her significant morbid obesity with BMI of 56. She understands there is a high possibility of a false positive which could lead to catheterization but she is still interested in further testing.  Follow-up after her Myoview.  Chrystie Nose, MD, [facility] Attending Cardiologist [facility]  Lisette Abu Junaid Wurzer 01/31/2016, 1:08 PM

## 2016-02-08 ENCOUNTER — Telehealth (HOSPITAL_COMMUNITY): Payer: Self-pay

## 2016-02-08 NOTE — Telephone Encounter (Signed)
Encounter complete. 

## 2016-02-13 ENCOUNTER — Ambulatory Visit (HOSPITAL_COMMUNITY)
Admission: RE | Admit: 2016-02-13 | Discharge: 2016-02-13 | Disposition: A | Discharge status: Home / Self Care | Payer: PRIVATE HEALTH INSURANCE | Source: Ambulatory Visit | Attending: Urology | Admitting: Urology

## 2016-02-13 DIAGNOSIS — R079 Chest pain, unspecified: Secondary | ICD-10-CM | POA: Insufficient documentation

## 2016-02-13 DIAGNOSIS — R9439 Abnormal result of other cardiovascular function study: Secondary | ICD-10-CM | POA: Diagnosis not present

## 2016-02-13 DIAGNOSIS — Z6841 Body Mass Index (BMI) 40.0 and over, adult: Secondary | ICD-10-CM | POA: Diagnosis not present

## 2016-02-13 DIAGNOSIS — Z8249 Family history of ischemic heart disease and other diseases of the circulatory system: Secondary | ICD-10-CM | POA: Diagnosis not present

## 2016-02-13 DIAGNOSIS — M25512 Pain in left shoulder: Secondary | ICD-10-CM | POA: Insufficient documentation

## 2016-02-13 DIAGNOSIS — R0609 Other forms of dyspnea: Secondary | ICD-10-CM | POA: Insufficient documentation

## 2016-02-13 DIAGNOSIS — I10 Essential (primary) hypertension: Secondary | ICD-10-CM | POA: Diagnosis not present

## 2016-02-13 DIAGNOSIS — R0602 Shortness of breath: Secondary | ICD-10-CM | POA: Insufficient documentation

## 2016-02-13 DIAGNOSIS — E669 Obesity, unspecified: Secondary | ICD-10-CM | POA: Insufficient documentation

## 2016-02-13 DIAGNOSIS — E119 Type 2 diabetes mellitus without complications: Secondary | ICD-10-CM | POA: Insufficient documentation

## 2016-02-13 DIAGNOSIS — R5383 Other fatigue: Secondary | ICD-10-CM | POA: Diagnosis not present

## 2016-02-13 MED ORDER — REGADENOSON 0.4 MG/5ML IV SOLN
0.4000 mg | Freq: Once | INTRAVENOUS | Status: AC
  Administered 2016-02-13: 0.4 mg via INTRAVENOUS

## 2016-02-13 MED ORDER — TECHNETIUM TC 99M SESTAMIBI GENERIC - CARDIOLITE
31.9000 | Freq: Once | INTRAVENOUS | Status: AC | PRN
  Administered 2016-02-13: 31.9 via INTRAVENOUS

## 2016-02-14 ENCOUNTER — Ambulatory Visit (HOSPITAL_COMMUNITY)
Admission: RE | Admit: 2016-02-14 | Discharge: 2016-02-14 | Disposition: A | Discharge status: Home / Self Care | Payer: PRIVATE HEALTH INSURANCE | Source: Ambulatory Visit | Attending: Cardiology | Admitting: Cardiology

## 2016-02-14 LAB — MYOCARDIAL PERFUSION IMAGING
CHL CUP NUCLEAR SRS: 1
LV dias vol: 110 mL
LV sys vol: 46 mL
Peak HR: 96 {beats}/min
Rest HR: 82 {beats}/min
SDS: 4
SSS: 5
TID: 0.96

## 2016-02-14 MED ORDER — TECHNETIUM TC 99M SESTAMIBI GENERIC - CARDIOLITE
31.4000 | Freq: Once | INTRAVENOUS | Status: AC | PRN
  Administered 2016-02-14: 31 via INTRAVENOUS

## 2016-02-21 ENCOUNTER — Encounter: Payer: Self-pay | Admitting: Internal Medicine

## 2016-02-21 ENCOUNTER — Other Ambulatory Visit: Payer: Self-pay | Admitting: *Deleted

## 2016-02-21 ENCOUNTER — Telehealth: Payer: Self-pay | Admitting: Internal Medicine

## 2016-02-21 ENCOUNTER — Telehealth: Payer: Self-pay | Admitting: *Deleted

## 2016-02-21 DIAGNOSIS — R9431 Abnormal electrocardiogram [ECG] [EKG]: Secondary | ICD-10-CM

## 2016-02-21 DIAGNOSIS — R079 Chest pain, unspecified: Secondary | ICD-10-CM

## 2016-02-21 DIAGNOSIS — Z01818 Encounter for other preprocedural examination: Secondary | ICD-10-CM

## 2016-02-21 DIAGNOSIS — D689 Coagulation defect, unspecified: Secondary | ICD-10-CM

## 2016-02-21 DIAGNOSIS — Z01812 Encounter for preprocedural laboratory examination: Secondary | ICD-10-CM

## 2016-02-21 DIAGNOSIS — Z79899 Other long term (current) drug therapy: Secondary | ICD-10-CM

## 2016-02-21 DIAGNOSIS — R9439 Abnormal result of other cardiovascular function study: Secondary | ICD-10-CM

## 2016-02-21 DIAGNOSIS — R5383 Other fatigue: Secondary | ICD-10-CM

## 2016-02-21 NOTE — Telephone Encounter (Signed)
Spoke with patient regarding abnormal [facility] results [facility] - patient agreed with MD plan and voiced understanding.  She is aware she will need to have lab work and a chest x-ray prior to the procedure.  She is aware a scheduler will contact her to arrange LHC

## 2016-02-21 NOTE — Telephone Encounter (Signed)
-----   Message from Chrystie NoseKenneth C Hilty, MD sent at 02/18/2016  1:31 PM EST ----- Abnormal nuclear stress test - she has had angina and new EKG changes. Still possible this could be a false positive, however, I would recommend a LHC - this coming week if possible. Can be with any cath physician.  Thanks.  Dr. HRexene Edison

## 2016-02-21 NOTE — Telephone Encounter (Signed)
Spoke with patient regarding heart cath ordered by Dr. Rennis GoldenHilty.   Scheduled for Monday 02/26/16 @ 10:30 am---arrival at short stay 8:30 am--last dose of Metformin  Saturday 02/24/16.  [facility]  Thursday 3/9/147.  Patient aware she will need someone to drive her home from the hospital.

## 2016-02-22 ENCOUNTER — Other Ambulatory Visit (HOSPITAL_COMMUNITY)
Admission: RE | Admit: 2016-02-22 | Discharge: 2016-02-22 | Disposition: A | Discharge status: Home / Self Care | Payer: PRIVATE HEALTH INSURANCE | Source: Ambulatory Visit | Attending: Internal Medicine | Admitting: Internal Medicine

## 2016-02-22 ENCOUNTER — Ambulatory Visit (HOSPITAL_COMMUNITY)
Admission: RE | Admit: 2016-02-22 | Discharge: 2016-02-22 | Disposition: A | Discharge status: Home / Self Care | Payer: PRIVATE HEALTH INSURANCE | Source: Ambulatory Visit | Attending: Internal Medicine | Admitting: Internal Medicine

## 2016-02-22 ENCOUNTER — Telehealth: Payer: Self-pay | Admitting: Internal Medicine

## 2016-02-22 DIAGNOSIS — D689 Coagulation defect, unspecified: Secondary | ICD-10-CM | POA: Insufficient documentation

## 2016-02-22 DIAGNOSIS — R918 Other nonspecific abnormal finding of lung field: Secondary | ICD-10-CM | POA: Diagnosis not present

## 2016-02-22 DIAGNOSIS — R5383 Other fatigue: Secondary | ICD-10-CM | POA: Diagnosis not present

## 2016-02-22 DIAGNOSIS — Z01812 Encounter for preprocedural laboratory examination: Secondary | ICD-10-CM | POA: Insufficient documentation

## 2016-02-22 DIAGNOSIS — Z01818 Encounter for other preprocedural examination: Secondary | ICD-10-CM

## 2016-02-22 DIAGNOSIS — Z79899 Other long term (current) drug therapy: Secondary | ICD-10-CM | POA: Insufficient documentation

## 2016-02-22 DIAGNOSIS — R079 Chest pain, unspecified: Secondary | ICD-10-CM | POA: Diagnosis present

## 2016-02-22 LAB — PROTIME-INR
INR: 0.98 (ref 0.00–1.49)
PROTHROMBIN TIME: 13.2 s (ref 11.6–15.2)

## 2016-02-22 LAB — TSH: TSH: 1.22 u[IU]/mL (ref 0.350–4.500)

## 2016-02-22 LAB — BASIC METABOLIC PANEL
Anion gap: 7 (ref 5–15)
BUN: 21 mg/dL — AB (ref 6–20)
CALCIUM: 9.3 mg/dL (ref 8.9–10.3)
CO2: 28 mmol/L (ref 22–32)
CREATININE: 0.78 mg/dL (ref 0.44–1.00)
Chloride: 105 mmol/L (ref 101–111)
GFR calc Af Amer: >60 mL/min (ref >60)
GLUCOSE: 103 mg/dL — AB (ref 65–99)
POTASSIUM: 3.7 mmol/L (ref 3.5–5.1)
Sodium: 140 mmol/L (ref 135–145)

## 2016-02-22 LAB — CBC
HEMATOCRIT: 41.4 % (ref 36.0–46.0)
Hemoglobin: 13.5 g/dL (ref 12.0–15.0)
MCH: 32.2 pg (ref 26.0–34.0)
MCHC: 32.6 g/dL (ref 30.0–36.0)
MCV: 98.8 fL (ref 78.0–100.0)
PLATELETS: 294 10*3/uL (ref 150–400)
RBC: 4.19 MIL/uL (ref 3.87–5.11)
RDW: 13.1 % (ref 11.5–15.5)
WBC: 9.5 10*3/uL (ref 4.0–10.5)

## 2016-02-22 LAB — APTT: APTT: 26 s (ref 24–37)

## 2016-02-22 NOTE — Telephone Encounter (Signed)
Patient presented at [facility] for pre-cath CXR - needed co-sign by MD - completed. Darel HongJudy can now access order and complete.

## 2016-02-22 NOTE — Telephone Encounter (Signed)
Calling about signature on orders

## 2016-02-24 DIAGNOSIS — R079 Chest pain, unspecified: Secondary | ICD-10-CM | POA: Diagnosis present

## 2016-02-24 DIAGNOSIS — R9439 Abnormal result of other cardiovascular function study: Secondary | ICD-10-CM | POA: Diagnosis present

## 2016-02-26 ENCOUNTER — Ambulatory Visit (HOSPITAL_COMMUNITY)
Admission: RE | Admit: 2016-02-26 | Discharge: 2016-02-26 | Disposition: A | Discharge status: Home / Self Care | Payer: PRIVATE HEALTH INSURANCE | Source: Ambulatory Visit | Attending: Cardiology | Admitting: Cardiology

## 2016-02-26 ENCOUNTER — Encounter (HOSPITAL_COMMUNITY)
Admission: RE | Disposition: A | Discharge status: Home / Self Care | Payer: Self-pay | Source: Ambulatory Visit | Attending: Cardiology

## 2016-02-26 DIAGNOSIS — E119 Type 2 diabetes mellitus without complications: Secondary | ICD-10-CM | POA: Diagnosis not present

## 2016-02-26 DIAGNOSIS — Z8249 Family history of ischemic heart disease and other diseases of the circulatory system: Secondary | ICD-10-CM | POA: Insufficient documentation

## 2016-02-26 DIAGNOSIS — I10 Essential (primary) hypertension: Secondary | ICD-10-CM | POA: Diagnosis not present

## 2016-02-26 DIAGNOSIS — R9431 Abnormal electrocardiogram [ECG] [EKG]: Secondary | ICD-10-CM | POA: Insufficient documentation

## 2016-02-26 DIAGNOSIS — Z6841 Body Mass Index (BMI) 40.0 and over, adult: Secondary | ICD-10-CM | POA: Diagnosis not present

## 2016-02-26 DIAGNOSIS — E785 Hyperlipidemia, unspecified: Secondary | ICD-10-CM | POA: Insufficient documentation

## 2016-02-26 DIAGNOSIS — Z7984 Long term (current) use of oral hypoglycemic drugs: Secondary | ICD-10-CM | POA: Diagnosis not present

## 2016-02-26 DIAGNOSIS — I517 Cardiomegaly: Secondary | ICD-10-CM | POA: Diagnosis not present

## 2016-02-26 DIAGNOSIS — R9439 Abnormal result of other cardiovascular function study: Secondary | ICD-10-CM | POA: Diagnosis present

## 2016-02-26 DIAGNOSIS — R079 Chest pain, unspecified: Secondary | ICD-10-CM | POA: Diagnosis present

## 2016-02-26 DIAGNOSIS — I2510 Atherosclerotic heart disease of native coronary artery without angina pectoris: Secondary | ICD-10-CM | POA: Diagnosis not present

## 2016-02-26 HISTORY — PX: CARDIAC CATHETERIZATION: SHX172

## 2016-02-26 LAB — GLUCOSE, CAPILLARY
GLUCOSE-CAPILLARY: 121 mg/dL — AB (ref 65–99)
Glucose-Capillary: 90 mg/dL (ref 65–99)

## 2016-02-26 SURGERY — LEFT HEART CATH AND CORONARY ANGIOGRAPHY

## 2016-02-26 MED ORDER — VERAPAMIL HCL 2.5 MG/ML IV SOLN
INTRAVENOUS | Status: AC
  Filled 2016-02-26: qty 2

## 2016-02-26 MED ORDER — NITROGLYCERIN 1 MG/10 ML FOR IR/CATH LAB
INTRA_ARTERIAL | Status: AC
  Filled 2016-02-26: qty 10

## 2016-02-26 MED ORDER — ACETAMINOPHEN 325 MG PO TABS: 650.0000 mg | ORAL_TABLET | ORAL | Status: DC | PRN

## 2016-02-26 MED ORDER — MIDAZOLAM HCL 2 MG/2ML IJ SOLN
INTRAMUSCULAR | Status: AC
  Filled 2016-02-26: qty 2

## 2016-02-26 MED ORDER — VERAPAMIL HCL 2.5 MG/ML IV SOLN
INTRAVENOUS | Status: DC | PRN
  Administered 2016-02-26: 15:00:00 via INTRA_ARTERIAL

## 2016-02-26 MED ORDER — HEPARIN SODIUM (PORCINE) 1000 UNIT/ML IJ SOLN
INTRAMUSCULAR | Status: DC | PRN
  Administered 2016-02-26: 6000 [IU] via INTRAVENOUS

## 2016-02-26 MED ORDER — MIDAZOLAM HCL 2 MG/2ML IJ SOLN
INTRAMUSCULAR | Status: DC | PRN
Start: 2016-02-26 — End: 2016-02-26
  Administered 2016-02-26: 2 mg via INTRAVENOUS

## 2016-02-26 MED ORDER — ONDANSETRON HCL 4 MG/2ML IJ SOLN: 4.0000 mg | Freq: Four times a day (QID) | INTRAMUSCULAR | Status: DC | PRN

## 2016-02-26 MED ORDER — SODIUM CHLORIDE 0.9 % IV SOLN
INTRAVENOUS | Status: DC
  Administered 2016-02-26: 09:00:00 via INTRAVENOUS

## 2016-02-26 MED ORDER — AMLODIPINE BESYLATE 5 MG PO TABS
5.0000 mg | ORAL_TABLET | Freq: Every day | ORAL | Status: DC
  Administered 2016-02-26: 5 mg via ORAL
  Filled 2016-02-26: qty 1

## 2016-02-26 MED ORDER — LIDOCAINE HCL (PF) 1 % IJ SOLN
INTRAMUSCULAR | Status: AC
  Filled 2016-02-26: qty 30

## 2016-02-26 MED ORDER — FENTANYL CITRATE (PF) 100 MCG/2ML IJ SOLN
INTRAMUSCULAR | Status: AC
  Filled 2016-02-26: qty 2

## 2016-02-26 MED ORDER — ASPIRIN 81 MG PO CHEW
81.0000 mg | CHEWABLE_TABLET | ORAL | Status: AC
  Administered 2016-02-26: 81 mg via ORAL

## 2016-02-26 MED ORDER — SODIUM CHLORIDE 0.9 % WEIGHT BASED INFUSION
3.0000 mL/kg/h | INTRAVENOUS | Status: DC
Start: 2016-02-26 — End: 2016-02-26
  Administered 2016-02-26: 3 mL/kg/h via INTRAVENOUS

## 2016-02-26 MED ORDER — SODIUM CHLORIDE 0.9% FLUSH: 3.0000 mL | INTRAVENOUS | Status: DC | PRN

## 2016-02-26 MED ORDER — HEPARIN SODIUM (PORCINE) 1000 UNIT/ML IJ SOLN
INTRAMUSCULAR | Status: AC
  Filled 2016-02-26: qty 1

## 2016-02-26 MED ORDER — ASPIRIN 81 MG PO CHEW
CHEWABLE_TABLET | ORAL | Status: AC
  Filled 2016-02-26: qty 1

## 2016-02-26 MED ORDER — HEPARIN (PORCINE) IN NACL 2-0.9 UNIT/ML-% IJ SOLN
INTRAMUSCULAR | Status: AC
  Filled 2016-02-26: qty 1500

## 2016-02-26 MED ORDER — LIDOCAINE HCL (PF) 1 % IJ SOLN
INTRAMUSCULAR | Status: DC | PRN
  Administered 2016-02-26: 15:00:00

## 2016-02-26 MED ORDER — IOHEXOL 350 MG/ML SOLN
INTRAVENOUS | Status: DC | PRN
  Administered 2016-02-26: 95 mL via INTRA_ARTERIAL

## 2016-02-26 MED ORDER — FENTANYL CITRATE (PF) 100 MCG/2ML IJ SOLN
INTRAMUSCULAR | Status: DC | PRN
  Administered 2016-02-26: 25 ug via INTRAVENOUS

## 2016-02-26 MED ORDER — SODIUM CHLORIDE 0.9 % IV SOLN: 250.0000 mL | INTRAVENOUS | Status: DC | PRN

## 2016-02-26 MED ORDER — SODIUM CHLORIDE 0.9% FLUSH: 3.0000 mL | Freq: Two times a day (BID) | INTRAVENOUS | Status: DC

## 2016-02-26 SURGICAL SUPPLY — 15 items
BRACE RADIAL COMPRESSION RADST (HEMOSTASIS) ×3 IMPLANT
CATH INFINITI 5 FR JL3.5 (CATHETERS) ×3 IMPLANT
CATH INFINITI 5FR ANG PIGTAIL (CATHETERS) ×3 IMPLANT
CATH INFINITI JR4 5F (CATHETERS) ×3 IMPLANT
CATH LAUNCHER 5F EBU3.0 (CATHETERS) ×1 IMPLANT
CATHETER LAUNCHER 5F EBU3.0 (CATHETERS) ×3
COVER PRB 48X5XTLSCP FOLD TPE (BAG) ×1 IMPLANT
COVER PROBE 5X48 (BAG) ×2
DEVICE RAD COMP TR BAND LRG (VASCULAR PRODUCTS) ×3 IMPLANT
GLIDESHEATH SLEND A-KIT 6F 22G (SHEATH) ×3 IMPLANT
KIT HEART LEFT (KITS) ×3 IMPLANT
PACK CARDIAC CATHETERIZATION (CUSTOM PROCEDURE TRAY) ×3 IMPLANT
TRANSDUCER W/STOPCOCK (MISCELLANEOUS) ×3 IMPLANT
TUBING CIL FLEX 10 FLL-RA (TUBING) ×3 IMPLANT
WIRE SAFE-T 1.5MM-J .035X260CM (WIRE) ×3 IMPLANT

## 2016-02-26 NOTE — H&P (View-Only) (Signed)
OFFICE NOTE  Chief Complaint:  Chest pain  Primary Care Physician: Robin Ribas, MD  HPI:  Robin Durham  Is a pleasant 60 year old female previously seen by Dr. Alanda Durham in 2013 for cardiomegaly. She does have a history of hypertension, dyslipidemia, and obesity  As well as diabetes. She had undergone stress testing which was negative for ischemia in the past. She also had an echocardiogram in 2013 which shows moderate LVH, grade 2 diastolic dysfunction , mild to moderate mitral annular calcification with mild regurgitation and mild left atrial enlargement.  She recently been describing some left upper chest and shoulder discomfort. She says it feels that her soreness in her chest that it's worse when pushing on. She describes it as a 1 or 2 out of 10 and it is constant , in fact it is been constant for about 3 months. Is not improved improved with rest or worsened with exertion.  She does have a history of a left shoulder problem and wonders if it could be related.  Robin Durham returns today for follow-up. She continues to have left-sided chest pain. She's had stress testing in the past which was negative for ischemia but it's been a number of years ago. Some of her symptoms sounded atypical and recommended nonsteroidals however that does not help. She's been more short of breath recently. Also she has an upper respiratory infection for the past several days. Echo as mentioned ready showed some grade 2 diastolic dysfunction and her blood pressure is not at goal.  PMHx:  Past Medical History  Diagnosis Date  . Hypertension   . Borderline diabetic   . Diabetes mellitus without complication (HCC)   . Hyperlipidemia     Past Surgical History  Procedure Laterality Date  . Hernia repair    . Cholecystectomy    . Doppler echocardiography  2007    showed normal LV function and normal LV size  . Doppler echocardiography  02/27/2012    LV EF 55-60%PATTERN OF MODERATE LVH,  MODERATE CONCENTRIC HYPERTROPHY; MITRAL VALVE MILDLY TO MODERATELY CALCIFIED ANNULUS- MILD REGURGITATION,LEFT ATRIUM MILDLY DILATED    FAMHx:  Family History  Problem Relation Age of Onset  . Aneurysm Mother   . Heart attack Father   . Heart disease Other   . Stroke Other     SOCHx:   reports that she has never smoked. She does not have any smokeless tobacco history on file. She reports that she does not drink alcohol or use illicit drugs.  ALLERGIES:  No Known Allergies  ROS: A comprehensive review of systems was negative except for: Cardiovascular: positive for chest pain  HOME MEDS: Current Outpatient Prescriptions  Medication Sig Dispense Refill  . acetaminophen (TYLENOL) 500 MG tablet Take 1,000 mg by mouth every 6 (six) hours as needed. Pain    . amLODipine (NORVASC) 5 MG tablet Take 5 mg by mouth daily.    . hydrochlorothiazide (MICROZIDE) 12.5 MG capsule Take 12.5 mg by mouth daily.    Marland Kitchen lisinopril (PRINIVIL,ZESTRIL) 20 MG tablet Take 20 mg by mouth daily.    Marland Kitchen loratadine (CLARITIN) 10 MG tablet Take 10 mg by mouth daily.    . metFORMIN (GLUCOPHAGE) 500 MG tablet Take 500 mg by mouth daily.    . potassium chloride (K-DUR) 10 MEQ tablet Take 10 mEq by mouth daily.    . pravastatin (PRAVACHOL) 40 MG tablet Take 40 mg by mouth daily.    Marland Kitchen torsemide (DEMADEX) 20 MG tablet Take  20 mg by mouth as needed.      No current facility-administered medications for this visit.    LABS/IMAGING: No results found for this or any previous visit (from the past 48 hour(s)). No results found.  VITALS: BP 156/92 mmHg  Pulse 78  Ht 5\' 3"  (1.6 m)  Wt 316 lb 14.4 oz (143.745 kg)  BMI 56.15 kg/m2  EXAM: General appearance: alert and no distress Neck: no carotid bruit and Thick neck, unable to assess JVP Lungs: diminished breath sounds bilaterally Heart: regular rate and rhythm, S1, S2 normal, no murmur, click, rub or gallop Abdomen: soft, non-tender; bowel sounds normal; no masses,   no organomegaly and Morbidly obese Extremities: extremities normal, atraumatic, no cyanosis or edema Pulses: 2+ and symmetric Skin: Skin color, texture, turgor normal. No rashes or lesions Neurologic: Grossly normal Psych: Pleasant  EKG: Normal sinus rhythm at 78, moderate voltage criteria for LVH Nonspecific T-wave changes which are new  ASSESSMENT: 1. Recurrent/progressive chest pain 2. Morbid obesity 3. Moderate LVH with Stage 2 diastolic dysfunction and left atrial enlargement, preserved LVEF 55% 4. Hypertension 5. Dyslipidemia 6. DM2  PLAN: 1.   Mrs. Kolker is describing more chest discomfort. I think this is related to weight and probably uncontrolled hypertension. She short of breath today but this is related to upper respiratory infection. She does have some new mild EKG changes including T-wave inversions laterally. Based on these findings and recurrent chest pain symptoms I like her to undergo another stress test. She'll need a 2 day study given her significant morbid obesity with BMI of 56. She understands there is a high possibility of a false positive which could lead to catheterization but she is still interested in further testing.  Follow-up after her Myoview.  Robin NoseKenneth C. Nasir Bright, MD, [facility] Attending Cardiologist [facility]  Robin AbuKenneth C Robin Durham 01/31/2016, 1:08 PM

## 2016-02-26 NOTE — Progress Notes (Signed)
Per Dr Herbie BaltimoreHarding client may be discharged one hour after rad stat band removed

## 2016-02-26 NOTE — Discharge Instructions (Signed)
NO METFORMIN/GLUCOPHAGE FOR 2 DAYS ° ° ° °Radial Site Care °Refer to this sheet in the next few weeks. These instructions provide you with information about caring for yourself after your procedure. Your health care provider may also give you more specific instructions. Your treatment has been planned according to current medical practices, but problems sometimes occur. Call your health care provider if you have any problems or questions after your procedure. °WHAT TO EXPECT AFTER THE PROCEDURE °After your procedure, it is typical to have the following: °· Bruising at the radial site that usually fades within 1-2 weeks. °· Blood collecting in the tissue (hematoma) that may be painful to the touch. It should usually decrease in size and tenderness within 1-2 weeks. °HOME CARE INSTRUCTIONS °· Take medicines only as directed by your health care provider. °· You may shower 24-48 hours after the procedure or as directed by your health care provider. Remove the bandage (dressing) and gently wash the site with plain soap and water. Pat the area dry with a clean towel. Do not rub the site, because this may cause bleeding. °· Do not take baths, swim, or use a hot tub until your health care provider approves. °· Check your insertion site every day for redness, swelling, or drainage. °· Do not apply powder or lotion to the site. °· Do not flex or bend the affected arm for 24 hours or as directed by your health care provider. °· Do not push or pull heavy objects with the affected arm for 24 hours or as directed by your health care provider. °· Do not lift over 10 lb (4.5 kg) for 5 days after your procedure or as directed by your health care provider. °· Ask your health care provider when it is okay to: °¨ Return to work or school. °¨ Resume usual physical activities or sports. °¨ Resume sexual activity. °· Do not drive home if you are discharged the same day as the procedure. Have someone else drive you. °· You may drive 24  hours after the procedure unless otherwise instructed by your health care provider. °· Do not operate machinery or power tools for 24 hours after the procedure. °· If your procedure was done as an outpatient procedure, which means that you went home the same day as your procedure, a responsible adult should be with you for the first 24 hours after you arrive home. °· Keep all follow-up visits as directed by your health care provider. This is important. °SEEK MEDICAL CARE IF: °· You have a fever. °· You have chills. °· You have increased bleeding from the radial site. Hold pressure on the site. °SEEK IMMEDIATE MEDICAL CARE IF: °· You have unusual pain at the radial site. °· You have redness, warmth, or swelling at the radial site. °· You have drainage (other than a small amount of blood on the dressing) from the radial site. °· The radial site is bleeding, and the bleeding does not stop after 30 minutes of holding steady pressure on the site. °· Your arm or hand becomes pale, cool, tingly, or numb. °  °This information is not intended to replace advice given to you by your health care provider. Make sure you discuss any questions you have with your health care provider. °  °Document Released: 01/04/2011 Document Revised: 12/23/2014 Document Reviewed: 06/20/2014 °Elsevier Interactive Patient Education ©2016 [employer] ° °

## 2016-02-26 NOTE — Research (Signed)
CADLAD Informed Consent   Subject Name: Robin Durham  Subject met inclusion and exclusion criteria.  The informed consent form, study requirements and expectations were reviewed with the subject and questions and concerns were addressed prior to the signing of the consent form.  The subject verbalized understanding of the trail requirements.  The subject agreed to participate in the CADLAD trial and signed the informed consent.  The informed consent was obtained prior to performance of any protocol-specific procedures for the subject.  A copy of the signed informed consent was given to the subject and a copy was placed in the subject's medical record.  Sandie Ano 02/26/2016, 9:35

## 2016-02-26 NOTE — Interval H&P Note (Signed)
History and Physical Interval Note:  02/26/2016 2:25 PM  Robin Durham  has presented today for surgery, with the diagnosis of abnormal stress test ordered for exertional dyspnea & chest pain.  The various methods of treatment have been discussed with the patient and family. After consideration of risks, benefits and other options for treatment, the patient has consented to  Procedure(s): Left Heart Cath and Coronary Angiography (N/A) with Possible Percutaneous Coronary Intervention as a surgical intervention .  The patient's history has been reviewed, patient examined, no change in status, stable for surgery.  I have reviewed the patient's chart and labs.  Questions were answered to the patient's satisfaction.    Cath Lab Visit (complete for each Cath Lab visit)  Clinical Evaluation Leading to the Procedure:   ACS: No.  Non-ACS:    Anginal Classification: CCS II  Anti-ischemic medical therapy: Minimal Therapy (1 class of medications)  Non-Invasive Test Results: Intermediate-risk stress test findings: cardiac mortality 1-3%/year  Prior CABG: No previous CABG  Ischemic Symptoms? CCS II (Slight limitation of ordinary activity) Anti-ischemic Medical Therapy? Minimal Therapy (1 class of medications) Non-invasive Test Results? Intermediate-risk stress test findings: cardiac mortality 1-3%/year Prior CABG? No Previous CABG   Patient Information:   1-2V CAD, no prox LAD  U (5)  Indication: 16; Score: 5   Patient Information:   CTO of 1 vessel, no other CAD  U (4)  Indication: 26; Score: 4   Patient Information:   1V CAD with prox LAD  U (6)  Indication: 32; Score: 6   Patient Information:   2V-CAD with prox LAD  A (7)  Indication: 38; Score: 7   Patient Information:   3V-CAD without LMCA  A (7)  Indication: 44; Score: 7   Patient Information:   3V-CAD without LMCA With Abnormal LV systolic function  A (9)  Indication: 48; Score: 9   Patient  Information:   LMCA-CAD  A (9)  Indication: 49; Score: 9   Patient Information:   2V-CAD with prox LAD PCI  A (7)  Indication: 62; Score: 7   Patient Information:   2V-CAD with prox LAD CABG  A (8)  Indication: 62; Score: 8   Patient Information:   3V-CAD without LMCA With Low CAD burden(i.e., 3 focal stenoses, low SYNTAX score) PCI  A (7)  Indication: 63; Score: 7   Patient Information:   3V-CAD without LMCA With Low CAD burden(i.e., 3 focal stenoses, low SYNTAX score) CABG  A (9)  Indication: 63; Score: 9   Patient Information:   3V-CAD without LMCA E06c - Intermediate-high CAD burden (i.e., multiple diffuse lesions, presence of CTO, or high SYNTAX score) PCI  U (4)  Indication: 64; Score: 4   Patient Information:   3V-CAD without LMCA E06c - Intermediate-high CAD burden (i.e., multiple diffuse lesions, presence of CTO, or high SYNTAX score) CABG  A (9)  Indication: 64; Score: 9   Patient Information:   LMCA-CAD With Isolated LMCA stenosis  PCI  U (6)  Indication: 65; Score: 6   Patient Information:   LMCA-CAD With Isolated LMCA stenosis  CABG  A (9)  Indication: 65; Score: 9   Patient Information:   LMCA-CAD Additional CAD, low CAD burden (i.e., 1- to 2-vessel additional involvement, low SYNTAX score) PCI  U (5)  Indication: 66; Score: 5   Patient Information:   LMCA-CAD Additional CAD, low CAD burden (i.e., 1- to 2-vessel additional involvement, low SYNTAX score) CABG  A (9)  Indication:  66; Score: 9   Patient Information:   LMCA-CAD Additional CAD, intermediate-high CAD burden (i.e., 3-vessel involvement, presence of CTO, or high SYNTAX score) PCI  I (3)  Indication: 67; Score: 3   Patient Information:   LMCA-CAD Additional CAD, intermediate-high CAD burden (i.e., 3-vessel involvement, presence of CTO, or high SYNTAX score) CABG  A (9)  Indication: 67; Score: 9   HARDING, DAVID W

## 2016-02-27 ENCOUNTER — Encounter (HOSPITAL_COMMUNITY): Payer: Self-pay | Admitting: Cardiology

## 2016-02-27 MED FILL — Nitroglycerin IV Soln 100 MCG/ML in D5W: INTRA_ARTERIAL | Qty: 10 | Status: AC

## 2016-02-28 ENCOUNTER — Ambulatory Visit (INDEPENDENT_AMBULATORY_CARE_PROVIDER_SITE_OTHER): Payer: PRIVATE HEALTH INSURANCE | Admitting: Internal Medicine

## 2016-02-28 ENCOUNTER — Encounter: Payer: Self-pay | Admitting: Internal Medicine

## 2016-02-28 VITALS — BP 118/78 | HR 88 | Ht 63.0 in | Wt 305.0 lb

## 2016-02-28 DIAGNOSIS — I519 Heart disease, unspecified: Secondary | ICD-10-CM | POA: Diagnosis not present

## 2016-02-28 DIAGNOSIS — I517 Cardiomegaly: Secondary | ICD-10-CM | POA: Diagnosis not present

## 2016-02-28 DIAGNOSIS — I5189 Other ill-defined heart diseases: Secondary | ICD-10-CM

## 2016-02-28 DIAGNOSIS — I10 Essential (primary) hypertension: Secondary | ICD-10-CM | POA: Diagnosis not present

## 2016-02-28 MED ORDER — METOPROLOL SUCCINATE ER 25 MG PO TB24: 25.0000 mg | ORAL_TABLET | Freq: Every day | ORAL | Status: DC

## 2016-02-28 NOTE — Progress Notes (Signed)
OFFICE NOTE  Chief Complaint:  Follow-up catheterization  Primary Care Physician: Colette Ribas, MD  HPI:  Robin Durham  Is a pleasant 60 year old female previously seen by Dr. Alanda Amass in 2013 for cardiomegaly. She does have a history of hypertension, dyslipidemia, and obesity  As well as diabetes. She had undergone stress testing which was negative for ischemia in the past. She also had an echocardiogram in 2013 which shows moderate LVH, grade 2 diastolic dysfunction , mild to moderate mitral annular calcification with mild regurgitation and mild left atrial enlargement.  She recently been describing some left upper chest and shoulder discomfort. She says it feels that her soreness in her chest that it's worse when pushing on. She describes it as a 1 or 2 out of 10 and it is constant , in fact it is been constant for about 3 months. Is not improved improved with rest or worsened with exertion.  She does have a history of a left shoulder problem and wonders if it could be related.  Ms. Tankard returns today for follow-up. She continues to have left-sided chest pain. She's had stress testing in the past which was negative for ischemia but it's been a number of years ago. Some of her symptoms sounded atypical and recommended nonsteroidals however that does not help. She's been more short of breath recently. Also she has an upper respiratory infection for the past several days. Echo as mentioned ready showed some grade 2 diastolic dysfunction and her blood pressure is not at goal.  Mrs. Rafferty returns today for follow-up. She was referred for cardiac catheterization performed by Dr. Herbie Baltimore. The results are as follows:  1. Dist LAD lesion, 30% stenosed. Otherwise no significant CAD. 2. The left ventricular systolic function is normal. 3. Normal LVEDP  Likely false-positive nuclear stress test. Would investigate non-coronary etiology for the patient's symptoms.   She is  relieved by those findings however still has intermittent left upper chest and shoulder pain. This could be musculoskeletal or orthopedic. I've encouraged her to follow-up with her primary care provider about this. Again I encouraged significant weight loss which may be helpful improving her symptoms. Being a diabetic and the fact that there was mild coronary artery disease, I am recommending she start on aspirin 81 mg daily. It would also be beneficial for her to be on a beta blocker in addition to her ace inhibitor. As her blood pressure is well-controlled I would recommend discontinuing amlodipine and changing her over to Toprol-XL 25 mg daily.  PMHx:  Past Medical History  Diagnosis Date  . Hypertension   . Borderline diabetic   . Diabetes mellitus without complication (HCC)   . Hyperlipidemia     Past Surgical History  Procedure Laterality Date  . Hernia repair    . Cholecystectomy    . Doppler echocardiography  2007    showed normal LV function and normal LV size  . Doppler echocardiography  02/27/2012    LV EF 55-60%PATTERN OF MODERATE LVH, MODERATE CONCENTRIC HYPERTROPHY; MITRAL VALVE MILDLY TO MODERATELY CALCIFIED ANNULUS- MILD REGURGITATION,LEFT ATRIUM MILDLY DILATED  . Cardiac catheterization N/A 02/26/2016    Procedure: Left Heart Cath and Coronary Angiography;  Surgeon: Marykay Lex, MD;  Location: [facility] INVASIVE CV LAB;  Service: Cardiovascular;  Laterality: N/A;    FAMHx:  Family History  Problem Relation Age of Onset  . Aneurysm Mother   . Heart attack Father   . Heart disease Other   . Stroke Other  SOCHx:   reports that she has never smoked. She does not have any smokeless tobacco history on file. She reports that she does not drink alcohol or use illicit drugs.  ALLERGIES:  No Known Allergies  ROS: A comprehensive review of systems was negative except for: Cardiovascular: positive for chest pain  HOME MEDS: Current Outpatient Prescriptions  Medication  Sig Dispense Refill  . acetaminophen (TYLENOL) 500 MG tablet Take 1,000 mg by mouth every 6 (six) hours as needed. Pain    . aspirin EC 81 MG tablet Take 81 mg by mouth daily.    . hydrochlorothiazide (MICROZIDE) 12.5 MG capsule Take 12.5 mg by mouth daily.    Marland Kitchen. lisinopril (PRINIVIL,ZESTRIL) 20 MG tablet Take 20 mg by mouth daily.    Marland Kitchen. loratadine (CLARITIN) 10 MG tablet Take 10 mg by mouth daily.    . metFORMIN (GLUCOPHAGE) 500 MG tablet Take 500 mg by mouth daily.    . potassium chloride (K-DUR) 10 MEQ tablet Take 10 mEq by mouth daily.    . pravastatin (PRAVACHOL) 40 MG tablet Take 40 mg by mouth daily.    Marland Kitchen. torsemide (DEMADEX) 20 MG tablet Take 20 mg by mouth as needed (edema).     . metoprolol succinate (TOPROL-XL) 25 MG 24 hr tablet Take 1 tablet (25 mg total) by mouth daily. 30 tablet 11   No current facility-administered medications for this visit.    LABS/IMAGING: Results for orders placed or performed during the hospital encounter of 02/26/16 (from the past 48 hour(s))  Glucose, capillary     Status: None   Collection Time: 02/26/16  3:50 PM  Result Value Ref Range   Glucose-Capillary 90 65 - 99 mg/dL   No results found.  VITALS: BP 118/78 mmHg  Pulse 88  Ht 5\' 3"  (1.6 m)  Wt 305 lb (138.347 kg)  BMI 54.04 kg/m2  EXAM: Deferred  EKG: Deferred  ASSESSMENT: 1. 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 Morbid obesity 3. Moderate LVH with Stage 2 diastolic dysfunction and left atrial enlargement, preserved LVEF 55% 4. Hypertension 5. Dyslipidemia 6. DM2  PLAN: 1.   Mrs. Warman  Had a false positive nuclear stress test which is reassuring. She did have some mild nonobstructive coronary disease in the distal LAD. I recommend intensify medical therapy for this. She is ready on pravastatin. She takes lisinopril. I would like to add aspirin 81 mg daily. In addition will change her amlodipine over to Toprol-XL 25 mg daily. She's  advised to monitor blood pressure and follow-up with her primary care provider. I'm happy to see her back annually or sooner as necessary.  Chrystie NoseKenneth C. Kalief Kattner, MD, Metro Health Medical CenterFACC Attending Cardiologist [facility]  Chrystie NoseKenneth C Deann Mclaine 02/28/2016, 12:14 PM

## 2016-02-28 NOTE — Patient Instructions (Signed)
Medication Changes  -- STOP norvasc  -- START metoprolol succinate (Toprol XL) 25mg  once daily  --START aspirin 81mg  once daily  Your physician wants you to follow-up in: 1 year with Dr. Rennis GoldenHilty. You will receive a reminder letter in the mail two months in advance. If you don't receive a letter, please call our office to schedule the follow-up appointment.

## 2017-01-28 ENCOUNTER — Encounter (HOSPITAL_COMMUNITY): Payer: Self-pay | Admitting: Emergency Medicine

## 2017-01-28 ENCOUNTER — Emergency Department (HOSPITAL_COMMUNITY)
Admission: EM | Admit: 2017-01-28 | Discharge: 2017-01-28 | Disposition: A | Discharge status: Home / Self Care | Payer: PRIVATE HEALTH INSURANCE | Attending: Emergency Medicine | Admitting: Emergency Medicine

## 2017-01-28 ENCOUNTER — Emergency Department (HOSPITAL_COMMUNITY): Payer: PRIVATE HEALTH INSURANCE

## 2017-01-28 DIAGNOSIS — I10 Essential (primary) hypertension: Secondary | ICD-10-CM | POA: Diagnosis not present

## 2017-01-28 DIAGNOSIS — Z7984 Long term (current) use of oral hypoglycemic drugs: Secondary | ICD-10-CM | POA: Diagnosis not present

## 2017-01-28 DIAGNOSIS — Z79899 Other long term (current) drug therapy: Secondary | ICD-10-CM | POA: Diagnosis not present

## 2017-01-28 DIAGNOSIS — R05 Cough: Secondary | ICD-10-CM | POA: Insufficient documentation

## 2017-01-28 DIAGNOSIS — M25512 Pain in left shoulder: Secondary | ICD-10-CM | POA: Diagnosis present

## 2017-01-28 DIAGNOSIS — Z7982 Long term (current) use of aspirin: Secondary | ICD-10-CM | POA: Diagnosis not present

## 2017-01-28 DIAGNOSIS — E119 Type 2 diabetes mellitus without complications: Secondary | ICD-10-CM | POA: Insufficient documentation

## 2017-01-28 DIAGNOSIS — R059 Cough, unspecified: Secondary | ICD-10-CM

## 2017-01-28 DIAGNOSIS — M62838 Other muscle spasm: Secondary | ICD-10-CM | POA: Diagnosis not present

## 2017-01-28 MED ORDER — METHOCARBAMOL 500 MG PO TABS
750.0000 mg | ORAL_TABLET | Freq: Once | ORAL | Status: AC
  Administered 2017-01-28: 750 mg via ORAL
  Filled 2017-01-28: qty 2

## 2017-01-28 MED ORDER — METHOCARBAMOL 500 MG PO TABS: 500.0000 mg | ORAL_TABLET | Freq: Two times a day (BID) | ORAL | 0 refills | Status: DC

## 2017-01-28 NOTE — ED Provider Notes (Signed)
[facility]-EMERGENCY DEPT Provider Note   CSN: [id number] Arrival date & time: 01/28/17  1817  By signing my name below, I, Rosario AdieWilliam Andrew Hiatt, attest that this documentation has been prepared under the direction and in the presence of Nira ConnPedro Eduardo Cardama, MD. Electronically Signed: Rosario AdieWilliam Andrew Hiatt, ED Scribe. 01/28/17. 9:10 PM.  History   Chief Complaint Chief Complaint  Patient presents with  . Shoulder Pain   The history is provided by the patient. No language interpreter was used.    HPI Comments: Robin Durham is a 61 y.o. female with a h/o DM, HTN, HLD, diastolic dysfunction, LVH, and obesity, who presents to the Emergency Department complaining of sudden onset, constant, throbbing posterior left shoulder pain beginning last night. She notes her pain is also present into her left-sided, posterior neck and the left side of her head. Pt reports that she was sitting during the onset of her pain and she has not sustained any recent falls, trauma, or injury to the area. No h/o similar pain. She notes that she has had a non-productive cough over the past few weeks, but denies her pain beginning with a cough. She was evaluated at her PCP for this issue and was dx'd w/ Sinusitis. Pt was placed on a course of antibiotics for this which she is still taking at home. Pain is exacerbated with coughing and movement of the joint. She took Tylenol at home without relief. No Hx of PE/DVT, recent long travel, surgery, fracture, prolonged immobilization, hormone use. No h/o cancer. She denies chest pain, shortness of breath, unilateral weakness/numbness, leg swelling, or any other associated symptoms.   Past Medical History:  Diagnosis Date  . Borderline diabetic   . Diabetes mellitus without complication (HCC)   . Hyperlipidemia   . Hypertension    Patient Active Problem List   Diagnosis Date Noted  . Abnormal EKG   . Left-sided chest wall pain 01/31/2015  . LVH (left ventricular  hypertrophy) 01/31/2015  . Diastolic dysfunction 01/31/2015  . Morbid obesity (HCC) 01/31/2015  . Essential hypertension 01/31/2015   Past Surgical History:  Procedure Laterality Date  . CARDIAC CATHETERIZATION N/A 02/26/2016   Procedure: Left Heart Cath and Coronary Angiography;  Surgeon: Marykay Lexavid W Harding, MD;  [facility];  Service: Cardiovascular;  Laterality: N/A;  . CHOLECYSTECTOMY    . DOPPLER ECHOCARDIOGRAPHY  2007   showed normal LV function and normal LV size  . DOPPLER ECHOCARDIOGRAPHY  02/27/2012   LV EF 55-60%PATTERN OF MODERATE LVH, MODERATE CONCENTRIC HYPERTROPHY; MITRAL VALVE MILDLY TO MODERATELY CALCIFIED ANNULUS- MILD REGURGITATION,LEFT ATRIUM MILDLY DILATED  . HERNIA REPAIR     OB History    No data available     Home Medications    Prior to Admission medications   Medication Sig Start Date End Date Taking? Authorizing Provider  acetaminophen (TYLENOL) 500 MG tablet Take 1,000 mg by mouth every 6 (six) hours as needed for mild pain or moderate pain. Pain    Yes Historical Provider, MD  amoxicillin-clavulanate (AUGMENTIN) 875-125 MG tablet Take 1 tablet by mouth 2 (two) times daily. 10 day course starting on 01/21/2017   Yes Historical Provider, MD  aspirin EC 81 MG tablet Take 81 mg by mouth daily.   Yes Historical Provider, MD  fluticasone (FLONASE) 50 MCG/ACT nasal spray Place 2 sprays into both nostrils daily.   Yes Historical Provider, MD  hydrochlorothiazide (MICROZIDE) 12.5 MG capsule Take 12.5 mg by mouth daily.   Yes Historical Provider, MD  lisinopril (PRINIVIL,ZESTRIL) 20 MG tablet Take 20 mg by mouth daily.   Yes Historical Provider, MD  loratadine (CLARITIN) 10 MG tablet Take 10 mg by mouth daily.   Yes Historical Provider, MD  metFORMIN (GLUCOPHAGE) 500 MG tablet Take 500 mg by mouth daily.   Yes Historical Provider, MD  Multiple Vitamins-Minerals (CENTRUM) tablet Take 1 tablet by mouth daily.   Yes Historical Provider, MD  potassium  chloride (K-DUR) 10 MEQ tablet Take 10 mEq by mouth daily.   Yes Historical Provider, MD  pravastatin (PRAVACHOL) 40 MG tablet Take 40 mg by mouth at bedtime.    Yes Historical Provider, MD  torsemide (DEMADEX) 20 MG tablet Take 20 mg by mouth as needed (edema).    Yes Historical Provider, MD  methocarbamol (ROBAXIN) 500 MG tablet Take 1 tablet (500 mg total) by mouth 2 (two) times daily. 01/28/17   Nira Conn, MD   Family History Family History  Problem Relation Age of Onset  . Aneurysm Mother   . Heart attack Father   . Heart disease Other   . Stroke Other    Social History Social History  Substance Use Topics  . Smoking status: Never Smoker  . Smokeless tobacco: Not on file  . Alcohol use No   Allergies   Patient has no known allergies.  Review of Systems Review of Systems  A complete 10 system review of systems was obtained and all systems are negative except as noted in the HPI and PMH.   Physical Exam Updated Vital Signs BP 176/99 (BP Location: Left Arm)   Pulse 86   Temp 98.9 F (37.2 C) (Oral)   Resp 22   Ht 5\' 3"  (1.6 m)   Wt (!) 304 lb (137.9 kg)   SpO2 96%   BMI 53.85 kg/m   Physical Exam  Constitutional: She is oriented to person, place, and time. She appears well-developed and well-nourished. No distress.  HENT:  Head: Normocephalic and atraumatic.  Nose: Nose normal.  Eyes: Conjunctivae and EOM are normal. Pupils are equal, round, and reactive to light. Right eye exhibits no discharge. Left eye exhibits no discharge. No scleral icterus.  Neck: Normal range of motion. Neck supple.  No carotid bruits.   Cardiovascular: Normal rate and regular rhythm.  Exam reveals no gallop and no friction rub.   No murmur heard. Pulmonary/Chest: Effort normal and breath sounds normal. No stridor. No respiratory distress. She has no rales.  Abdominal: Soft. She exhibits no distension. There is no tenderness.  Musculoskeletal: She exhibits tenderness. She  exhibits no edema.  Tenderness over the upper and middle fibers of the left trapezius muscle. Full ROM of the left shoulder but with pain.   Neurological: She is alert and oriented to person, place, and time.  5/5 strength to bilateral upper extremities.   Skin: Skin is warm and dry. No rash noted. She is not diaphoretic. No erythema.  Psychiatric: She has a normal mood and affect.  Vitals reviewed.  ED Treatments / Results  DIAGNOSTIC STUDIES: Oxygen Saturation is 96% on RA, normal by my interpretation.   COORDINATION OF CARE: 9:09 PM-Discussed next steps with pt. Pt verbalized understanding and is agreeable with the plan.   Labs (all labs ordered are listed, but only abnormal results are displayed) Labs Reviewed - No data to display  EKG  EKG Interpretation  Date/Time:  Tuesday January 28 2017 18:36:53 EST Ventricular Rate:  92 PR Interval:  156 QRS Duration: 78 QT Interval:  358 QTC Calculation: 442 R Axis:   4 Text Interpretation:  Normal sinus rhythm Moderate voltage criteria for LVH, may be normal variant T wave abnormality, consider inferolateral ischemia Abnormal ECG No significant change since last tracing Confirmed by [facility] MD, PEDRO [phone]) on 01/28/2017 9:11:35 PM      Radiology Dg Chest 2 View  Result Date: 01/28/2017 CLINICAL DATA:  Cough for couple of weeks, not getting better. History of hypertension, pneumonia, diabetes, cardiac catheterization. EXAM: CHEST  2 VIEW COMPARISON:  02/22/2016 FINDINGS: The heart size and mediastinal contours are within normal limits. Both lungs are clear. The visualized skeletal structures are unremarkable. IMPRESSION: No active cardiopulmonary disease. Electronically Signed   By: Burman Nieves M.D.   On: 01/28/2017 21:47    Procedures Procedures   Medications Ordered in ED Medications  methocarbamol (ROBAXIN) tablet 750 mg (750 mg Oral Given 01/28/17 2144)    Initial Impression / Assessment and Plan / ED Course  I  have reviewed the triage vital signs and the nursing notes.  Pertinent labs & imaging results that were available during my care of the patient were reviewed by me and considered in my medical decision making (see chart for details).     CXR w/o PNA or pancoast tumor. Low suspicion for carotid dissection, cardiac etiology, or PE. Consistent with muscle spasm of the left trapezius muscle. Symptomatic treatment discussed.  The patient is safe for discharge with strict return precautions.   Final Clinical Impressions(s) / ED Diagnoses   Final diagnoses:  Cough  Acute pain of left shoulder  Muscle spasm   Disposition: Discharge  Condition: Good  I have discussed the results, Dx and Tx plan with the patient who expressed understanding and agree(s) with the plan. Discharge instructions discussed at great length. The patient was given strict return precautions who verbalized understanding of the instructions. No further questions at time of discharge.    New Prescriptions   METHOCARBAMOL (ROBAXIN) 500 MG TABLET    Take 1 tablet (500 mg total) by mouth 2 (two) times daily.    Follow Up: Assunta Found, MD [address] [place] [address] [phone]  Schedule an appointment as soon as possible for a visit  in 5-7 days, If symptoms do not improve or  worsen   I personally performed the services described in this documentation, which was scribed in my presence. The recorded information has been reviewed and is accurate.        Nira Conn, MD 01/28/17 2214

## 2017-01-28 NOTE — ED Notes (Signed)
EKG given to Dr. Liu.  

## 2017-01-28 NOTE — ED Triage Notes (Signed)
Pt reports symptoms starting last week, states left shoulder is sore and was dx with pulled muscle and a sinus infection, given antibiotic and steroids.  Pt reports that her whole left shoulder blade is hurting, as well as neck and back.  Denies injury.  Pt alert and oriented.

## 2017-02-05 IMAGING — DX DG CHEST 2V
2 series · 2 of 2 positions shown · non-contrast
Comparison: 03/22/2014.

CLINICAL DATA: Chest pain.

EXAM:
CHEST  2 VIEW

[chest pa]
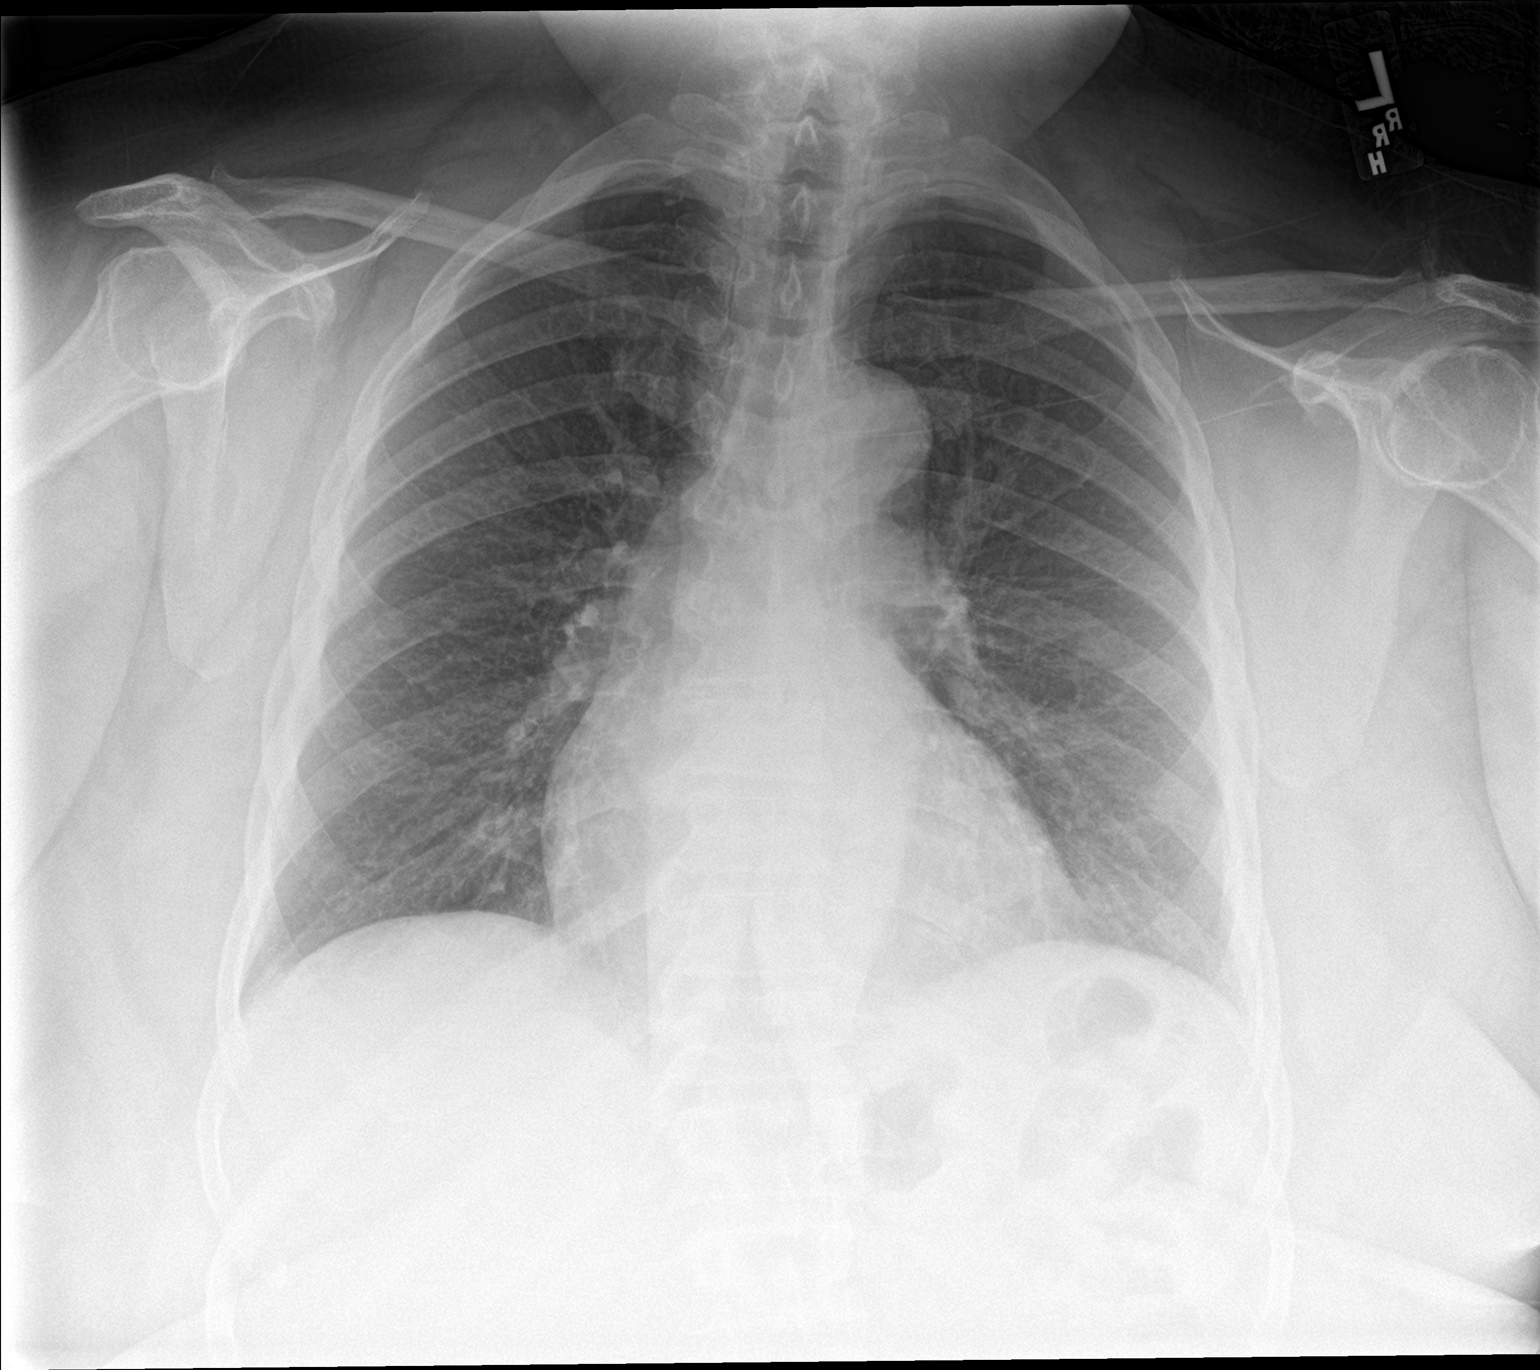

[chest lat]
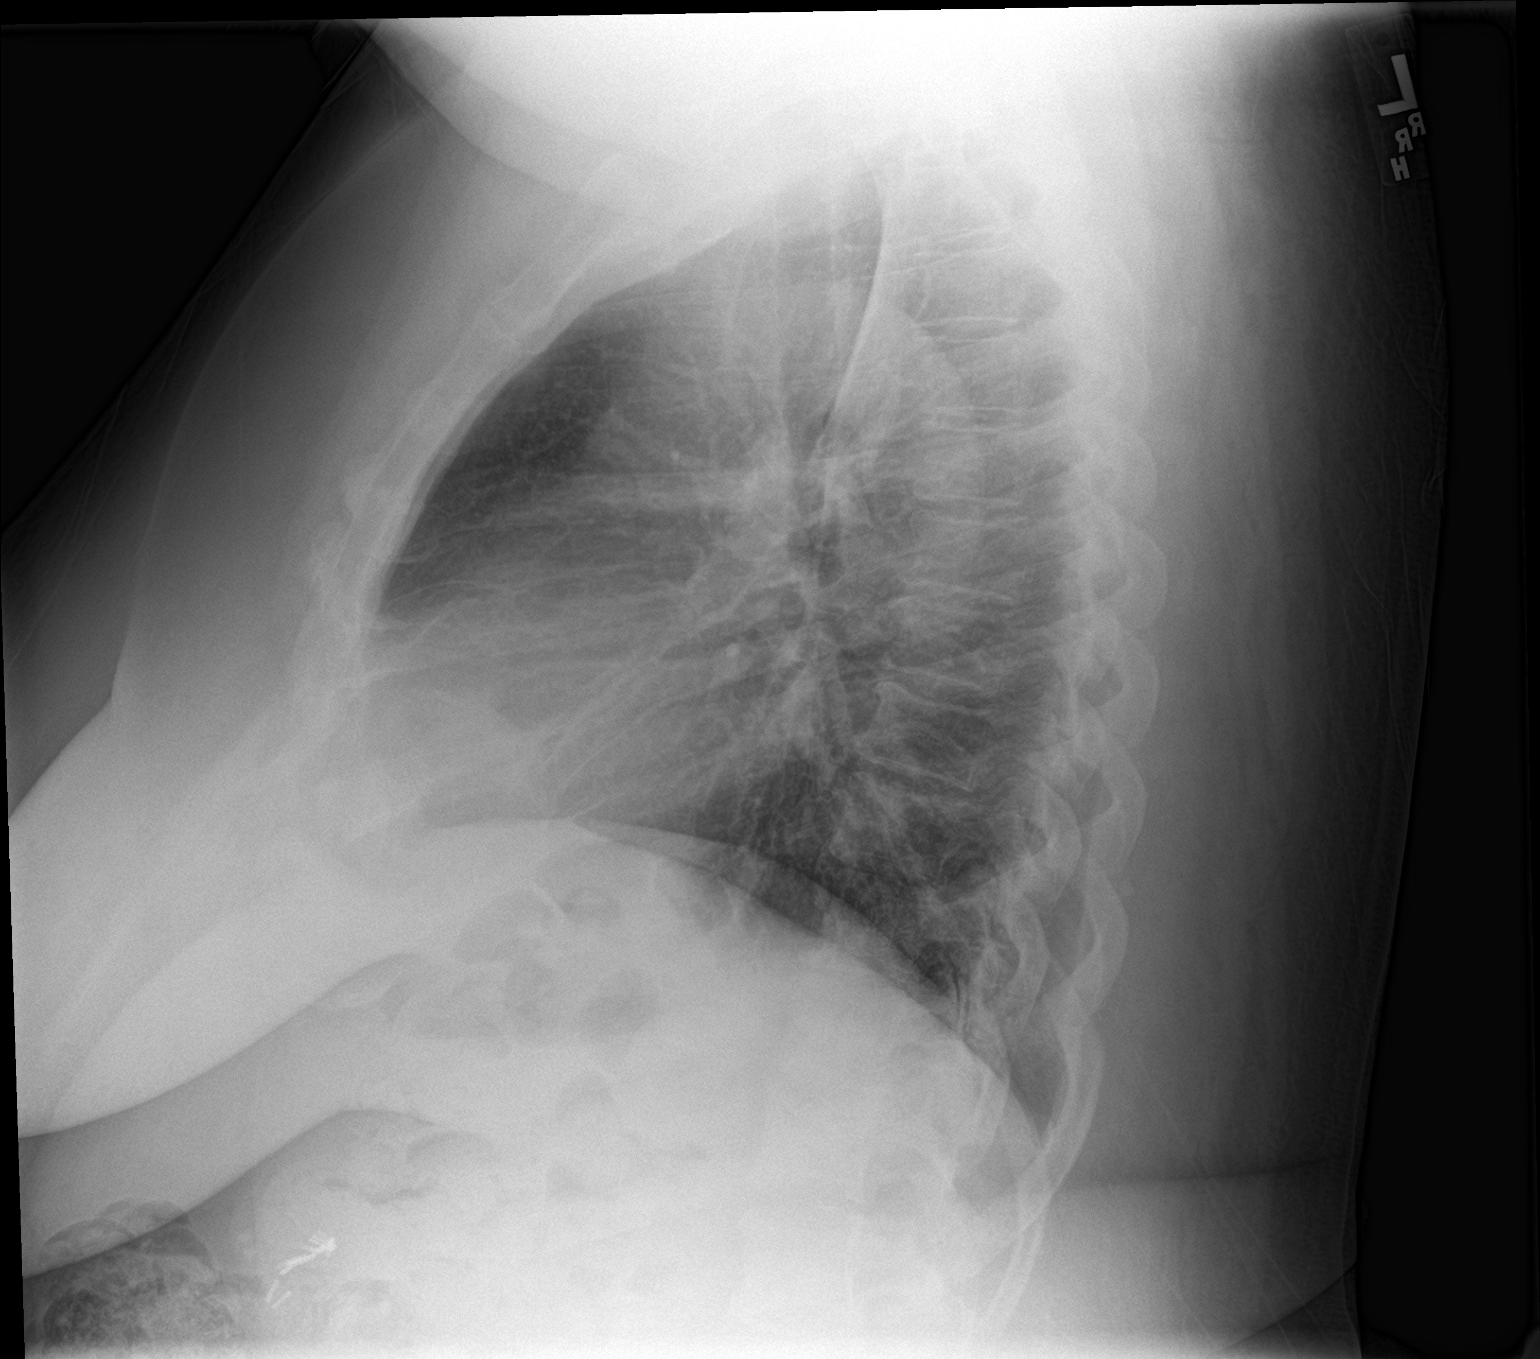

[2 of 2 positions shown; findings below may reference images not displayed]

FINDINGS: Mediastinum and hilar structures normal. Low lung volumes with mild
bibasilar subsegmental atelectasis. No pleural effusion or
pneumothorax. Stable mild with normal pulmonary vascularity. No
acute bony abnormality . Surgical clips upper abdomen.
IMPRESSION: 1.  Low lung volumes with mild bibasilar atelectasis.

2.  Stable mild cardiomegaly.  No pulmonary venous congestion.

## 2017-03-13 ENCOUNTER — Ambulatory Visit (INDEPENDENT_AMBULATORY_CARE_PROVIDER_SITE_OTHER): Payer: PRIVATE HEALTH INSURANCE | Admitting: Internal Medicine

## 2017-03-13 ENCOUNTER — Encounter: Payer: Self-pay | Admitting: Internal Medicine

## 2017-03-13 VITALS — BP 191/114 | HR 71 | Ht 63.0 in | Wt 307.8 lb

## 2017-03-13 DIAGNOSIS — I2510 Atherosclerotic heart disease of native coronary artery without angina pectoris: Secondary | ICD-10-CM | POA: Diagnosis not present

## 2017-03-13 DIAGNOSIS — I10 Essential (primary) hypertension: Secondary | ICD-10-CM

## 2017-03-13 DIAGNOSIS — E785 Hyperlipidemia, unspecified: Secondary | ICD-10-CM | POA: Diagnosis not present

## 2017-03-13 LAB — LIPID PANEL
CHOL/HDL RATIO: 3.6 ratio (ref <5.0)
Cholesterol: 196 mg/dL (ref <200)
HDL: 55 mg/dL (ref >50)
LDL CALC: 112 mg/dL — AB (ref <100)
Triglycerides: 147 mg/dL (ref <150)
VLDL: 29 mg/dL (ref <30)

## 2017-03-13 MED ORDER — NEBIVOLOL HCL 10 MG PO TABS: 10.0000 mg | ORAL_TABLET | Freq: Every day | ORAL | 5 refills | Status: DC

## 2017-03-13 NOTE — Patient Instructions (Addendum)
Your physician has recommended you make the following change in your medication:  -- START bystolic 10mg  once daily  - 3 boxes of samples + co-pay card given to patient  Your physician recommends that you return for lab work FASTING to check cholesterol   Your physician recommends that you schedule a follow-up appointment in: 2-3 weeks with clinical pharmacist in our office for a blood pressure check appointment   Please monitor your blood pressure at home.  Please check your BP no more than twice daily -- check at least 1 hour after taking BP meds -- check after sitting, resting for 5-10 minutes  Your physician wants you to follow-up in: 6 months with Dr. Rennis GoldenHilty. You will receive a reminder letter in the mail two months in advance. If you don't receive a letter, please call our office to schedule the follow-up appointment.

## 2017-03-13 NOTE — Progress Notes (Signed)
OFFICE NOTE  Chief Complaint:  Left shoulder pain  Primary Care Physician: Colette RibasGOLDING, JOHN CABOT, MD  HPI:  Robin Durham  Is a pleasant 61 year old female previously seen by Dr. Alanda AmassWeintraub in 2013 for cardiomegaly. She does have a history of hypertension, dyslipidemia, and obesity  As well as diabetes. She had undergone stress testing which was negative for ischemia in the past. She also had an echocardiogram in 2013 which shows moderate LVH, grade 2 diastolic dysfunction , mild to moderate mitral annular calcification with mild regurgitation and mild left atrial enlargement.  She recently been describing some left upper chest and shoulder discomfort. She says it feels that her soreness in her chest that it's worse when pushing on. She describes it as a 1 or 2 out of 10 and it is constant , in fact it is been constant for about 3 months. Is not improved improved with rest or worsened with exertion.  She does have a history of a left shoulder problem and wonders if it could be related.  Robin Durham returns today for follow-up. She continues to have left-sided chest pain. She's had stress testing in the past which was negative for ischemia but it's been a number of years ago. Some of her symptoms sounded atypical and recommended nonsteroidals however that does not help. She's been more short of breath recently. Also she has an upper respiratory infection for the past several days. Echo as mentioned ready showed some grade 2 diastolic dysfunction and her blood pressure is not at goal.  Robin Durham returns today for follow-up. She was referred for cardiac catheterization performed by Dr. Herbie BaltimoreHarding. The results are as follows:  1. Dist LAD lesion, 30% stenosed. Otherwise no significant CAD. 2. The left ventricular systolic function is normal. 3. Normal LVEDP  Likely false-positive nuclear stress test. Would investigate non-coronary etiology for the patient's symptoms.   She is relieved  by those findings however still has intermittent left upper chest and shoulder pain. This could be musculoskeletal or orthopedic. I've encouraged her to follow-up with her primary care provider about this. Again I encouraged significant weight loss which may be helpful improving her symptoms. Being a diabetic and the fact that there was mild coronary artery disease, I am recommending she start on aspirin 81 mg daily. It would also be beneficial for her to be on a beta blocker in addition to her ace inhibitor. As her blood pressure is well-controlled I would recommend discontinuing amlodipine and changing her over to Toprol-XL 25 mg daily.  03/13/2017  Robin Durham returns for follow-up. Over the past year she denies any worsening shortness of breath or chest pain. She has had some headache which was probably related to Toprol-XL and cause her to discontinue the medicine. Surprisingly her blood pressure today was elevated 191/114. I did recheck her blood pressure which came out 160/90. She said she did not taken her lisinopril today, and previously her blood pressures been well controlled, however it seems to be much more elevated. I asked her what her blood pressure readings were at home and she said they average about 145-150 systolic, which would still be higher than goal. Weight has increased from [phone] since I last saw her. She reports that she seems to sleep well at night although has been told she snores in the past. Her husband who is deceased had sleep apnea and wore mask she's never had a sleep study. She's considering whether she wants to have a sleep study  but not at this time. Had a recent lipid profile and is on pravastatin.  PMHx:  Past Medical History:  Diagnosis Date  . Borderline diabetic   . Diabetes mellitus without complication (HCC)   . Hyperlipidemia   . Hypertension     Past Surgical History:  Procedure Laterality Date  . CARDIAC CATHETERIZATION N/A 02/26/2016    Procedure: Left Heart Cath and Coronary Angiography;  Surgeon: Marykay Lex, MD;  Location: [facility] INVASIVE CV LAB;  Service: Cardiovascular;  Laterality: N/A;  . CHOLECYSTECTOMY    . DOPPLER ECHOCARDIOGRAPHY  2007   showed normal LV function and normal LV size  . DOPPLER ECHOCARDIOGRAPHY  02/27/2012   LV EF 55-60%PATTERN OF MODERATE LVH, MODERATE CONCENTRIC HYPERTROPHY; MITRAL VALVE MILDLY TO MODERATELY CALCIFIED ANNULUS- MILD REGURGITATION,LEFT ATRIUM MILDLY DILATED  . HERNIA REPAIR      FAMHx:  Family History  Problem Relation Age of Onset  . Aneurysm Mother   . Heart attack Father   . Heart disease Other   . Stroke Other     SOCHx:   reports that she has never smoked. She has never used smokeless tobacco. She reports that she does not drink alcohol or use drugs.  ALLERGIES:  No Known Allergies  ROS: Pertinent items noted in HPI and remainder of comprehensive ROS otherwise negative.  HOME MEDS: Current Outpatient Prescriptions  Medication Sig Dispense Refill  . acetaminophen (TYLENOL) 500 MG tablet Take 1,000 mg by mouth every 6 (six) hours as needed for mild pain or moderate pain. Pain     . aspirin EC 81 MG tablet Take 81 mg by mouth daily.    . fluticasone (FLONASE) 50 MCG/ACT nasal spray Place 2 sprays into both nostrils daily.    . hydrochlorothiazide (MICROZIDE) 12.5 MG capsule Take 12.5 mg by mouth daily.    Marland Kitchen lisinopril (PRINIVIL,ZESTRIL) 20 MG tablet Take 20 mg by mouth daily.    Marland Kitchen loratadine (CLARITIN) 10 MG tablet Take 10 mg by mouth daily.    . metFORMIN (GLUCOPHAGE) 500 MG tablet Take 500 mg by mouth daily.    . Multiple Vitamins-Minerals (CENTRUM) tablet Take 1 tablet by mouth daily.    . potassium chloride (K-DUR) 10 MEQ tablet Take 10 mEq by mouth daily.    . pravastatin (PRAVACHOL) 40 MG tablet Take 40 mg by mouth at bedtime.     . torsemide (DEMADEX) 20 MG tablet Take 20 mg by mouth as needed (edema).     Marland Kitchen amoxicillin-clavulanate (AUGMENTIN) 875-125 MG  tablet Take 1 tablet by mouth 2 (two) times daily. 10 day course starting on 01/21/2017    . methocarbamol (ROBAXIN) 500 MG tablet Take 1 tablet (500 mg total) by mouth 2 (two) times daily. (Patient not taking: Reported on 03/13/2017) 20 tablet 0  . nebivolol (BYSTOLIC) 10 MG tablet Take 1 tablet (10 mg total) by mouth daily. 30 tablet 5   No current facility-administered medications for this visit.     LABS/IMAGING: No results found for this or any previous visit (from the past 48 hour(s)). No results found.  VITALS: BP (!) 191/114   Pulse 71   Ht 5\' 3"  (1.6 m)   Wt (!) 307 lb 12.8 oz (139.6 kg)   SpO2 95%   BMI 54.52 kg/m   EXAM: General appearance: alert, no distress and morbidly obese Neck: no carotid bruit and no JVD Lungs: clear to auscultation bilaterally Heart: regular rate and rhythm, S1, S2 normal, no murmur, click, rub or gallop Abdomen:  soft, non-tender; bowel sounds normal; no masses,  no organomegaly Extremities: extremities normal, atraumatic, no cyanosis or edema Pulses: 2+ and symmetric Skin: Skin color, texture, turgor normal. No rashes or lesions Neurologic: Grossly normal PSych: Pleasant  EKG: Normal sinus rhythm 71, minimal voltage criteria for LVH  ASSESSMENT: 1. Chest wall pain - mild, non-obstructive single vessel LAD -CAD on cath (02/2016) 2. Morbid obesity 3. Moderate LVH with Stage 2 diastolic dysfunction and left atrial enlargement, preserved LVEF 55% 4. Hypertension 5. Dyslipidemia 6. DM2  PLAN: 1.   Robin Durham had very high blood pressure today. She says at home it's been running in the 140-150 systolic range. She is on lisinopril 20 and HCTZ. I like to add Bystolic 10 mg daily to her regimen. Will provide a co-pay assistance card with that. Hopefully this will be better tolerated than the Toprol. She would benefit from some beta-blockade and blood pressure lowering. I like for her to follow blood pressures at home and bring her cuff in for  follow-up appointment in her hypertensive pharmacy clinic. We'll go ahead and check a lipid profile as well and adjust medication accordingly.  Chrystie Nose, MD, [facility] Attending Cardiologist [facility]  Chrystie Nose 03/13/2017, 10:29 AM

## 2017-03-18 ENCOUNTER — Encounter: Payer: Self-pay | Admitting: Internal Medicine

## 2017-03-18 ENCOUNTER — Telehealth: Payer: Self-pay | Admitting: Internal Medicine

## 2017-03-18 DIAGNOSIS — E785 Hyperlipidemia, unspecified: Secondary | ICD-10-CM

## 2017-03-18 MED ORDER — ROSUVASTATIN CALCIUM 40 MG PO TABS: 40.0000 mg | ORAL_TABLET | Freq: Every day | ORAL | 3 refills | Status: DC

## 2017-03-18 NOTE — Telephone Encounter (Signed)
Called patient w/lab results Patient voiced understanding of MD recommendations & agrees w/plan She just picked up 90 day supply of pravastatin  and wishes to finish this medication before making med change.  Rx(s) sent to pharmacy electronically & lab slip mailed to patient. She is aware to have repeat fasting labs 3 months after med change.   She also reports that she picked up Bystolic Rx and it was $119 and she would like a cheaper alternative. She states that the co-pay card would not work with her insurance. Informed her I would notify MD and seek recommendations but this may be best discussed at her 4/20 BP check appt. She voiced understanding.

## 2017-03-19 NOTE — Telephone Encounter (Signed)
Continue Bystolic and we will discuss change when she returns.  Dr. Rexene Edison

## 2017-03-20 NOTE — Telephone Encounter (Signed)
LM for patient that MD wishes her to remain on bystolic for now and any potential med changes can be discussed at appt on 4/20 with clinical pharmacist

## 2017-04-07 ENCOUNTER — Other Ambulatory Visit (HOSPITAL_COMMUNITY): Payer: Self-pay | Admitting: Family Medicine

## 2017-04-07 DIAGNOSIS — Z1231 Encounter for screening mammogram for malignant neoplasm of breast: Secondary | ICD-10-CM

## 2017-04-11 ENCOUNTER — Ambulatory Visit (HOSPITAL_COMMUNITY)
Admission: RE | Admit: 2017-04-11 | Discharge: 2017-04-11 | Disposition: A | Discharge status: Home / Self Care | Payer: PRIVATE HEALTH INSURANCE | Source: Ambulatory Visit | Attending: Family Medicine | Admitting: Family Medicine

## 2017-04-11 DIAGNOSIS — Z1231 Encounter for screening mammogram for malignant neoplasm of breast: Secondary | ICD-10-CM | POA: Diagnosis present

## 2017-04-14 ENCOUNTER — Ambulatory Visit (HOSPITAL_COMMUNITY): Payer: PRIVATE HEALTH INSURANCE

## 2017-12-04 ENCOUNTER — Ambulatory Visit (INDEPENDENT_AMBULATORY_CARE_PROVIDER_SITE_OTHER): Payer: PRIVATE HEALTH INSURANCE | Admitting: Otolaryngology

## 2017-12-04 DIAGNOSIS — J343 Hypertrophy of nasal turbinates: Secondary | ICD-10-CM

## 2017-12-04 DIAGNOSIS — J310 Chronic rhinitis: Secondary | ICD-10-CM

## 2017-12-04 DIAGNOSIS — H6983 Other specified disorders of Eustachian tube, bilateral: Secondary | ICD-10-CM

## 2017-12-04 DIAGNOSIS — H903 Sensorineural hearing loss, bilateral: Secondary | ICD-10-CM

## 2018-06-30 ENCOUNTER — Other Ambulatory Visit: Payer: Self-pay

## 2018-06-30 ENCOUNTER — Emergency Department (HOSPITAL_COMMUNITY)
Admission: EM | Admit: 2018-06-30 | Discharge: 2018-07-01 | Disposition: A | Discharge status: Home / Self Care | Payer: PRIVATE HEALTH INSURANCE | Attending: Emergency Medicine | Admitting: Emergency Medicine

## 2018-06-30 ENCOUNTER — Encounter (HOSPITAL_COMMUNITY): Payer: Self-pay | Admitting: Emergency Medicine

## 2018-06-30 ENCOUNTER — Emergency Department (HOSPITAL_COMMUNITY): Payer: PRIVATE HEALTH INSURANCE

## 2018-06-30 DIAGNOSIS — X58XXXA Exposure to other specified factors, initial encounter: Secondary | ICD-10-CM | POA: Diagnosis not present

## 2018-06-30 DIAGNOSIS — Z7984 Long term (current) use of oral hypoglycemic drugs: Secondary | ICD-10-CM | POA: Diagnosis not present

## 2018-06-30 DIAGNOSIS — R55 Syncope and collapse: Secondary | ICD-10-CM | POA: Diagnosis present

## 2018-06-30 DIAGNOSIS — Y939 Activity, unspecified: Secondary | ICD-10-CM | POA: Diagnosis not present

## 2018-06-30 DIAGNOSIS — Y999 Unspecified external cause status: Secondary | ICD-10-CM | POA: Insufficient documentation

## 2018-06-30 DIAGNOSIS — E119 Type 2 diabetes mellitus without complications: Secondary | ICD-10-CM | POA: Diagnosis not present

## 2018-06-30 DIAGNOSIS — Z79899 Other long term (current) drug therapy: Secondary | ICD-10-CM | POA: Diagnosis not present

## 2018-06-30 DIAGNOSIS — Y929 Unspecified place or not applicable: Secondary | ICD-10-CM | POA: Insufficient documentation

## 2018-06-30 DIAGNOSIS — I2510 Atherosclerotic heart disease of native coronary artery without angina pectoris: Secondary | ICD-10-CM | POA: Insufficient documentation

## 2018-06-30 DIAGNOSIS — R42 Dizziness and giddiness: Secondary | ICD-10-CM | POA: Insufficient documentation

## 2018-06-30 DIAGNOSIS — T162XXA Foreign body in left ear, initial encounter: Secondary | ICD-10-CM | POA: Diagnosis not present

## 2018-06-30 DIAGNOSIS — I10 Essential (primary) hypertension: Secondary | ICD-10-CM | POA: Insufficient documentation

## 2018-06-30 DIAGNOSIS — Z7982 Long term (current) use of aspirin: Secondary | ICD-10-CM | POA: Diagnosis not present

## 2018-06-30 LAB — BASIC METABOLIC PANEL
Anion gap: 8 (ref 5–15)
BUN: 29 mg/dL — AB (ref 8–23)
CHLORIDE: 104 mmol/L (ref 98–111)
CO2: 27 mmol/L (ref 22–32)
CREATININE: 1.13 mg/dL — AB (ref 0.44–1.00)
Calcium: 9.4 mg/dL (ref 8.9–10.3)
GFR calc Af Amer: 59 mL/min — ABNORMAL LOW (ref >60)
GFR calc non Af Amer: 51 mL/min — ABNORMAL LOW (ref >60)
GLUCOSE: 135 mg/dL — AB (ref 70–99)
Potassium: 3.5 mmol/L (ref 3.5–5.1)
Sodium: 139 mmol/L (ref 135–145)

## 2018-06-30 LAB — CBC
HCT: 41.2 % (ref 36.0–46.0)
Hemoglobin: 13.8 g/dL (ref 12.0–15.0)
MCH: 33.5 pg (ref 26.0–34.0)
MCHC: 33.5 g/dL (ref 30.0–36.0)
MCV: 100 fL (ref 78.0–100.0)
PLATELETS: 301 10*3/uL (ref 150–400)
RBC: 4.12 MIL/uL (ref 3.87–5.11)
RDW: 13.5 % (ref 11.5–15.5)
WBC: 10.2 10*3/uL (ref 4.0–10.5)

## 2018-06-30 LAB — CBG MONITORING, ED: Glucose-Capillary: 128 mg/dL — ABNORMAL HIGH (ref 70–99)

## 2018-06-30 LAB — TROPONIN I: Troponin I: <0.03 ng/mL (ref <0.03)

## 2018-06-30 LAB — CK: Total CK: 137 U/L (ref 38–234)

## 2018-06-30 LAB — BRAIN NATRIURETIC PEPTIDE: B NATRIURETIC PEPTIDE 5: 13 pg/mL (ref 0.0–100.0)

## 2018-06-30 MED ORDER — MECLIZINE HCL 12.5 MG PO TABS
25.0000 mg | ORAL_TABLET | Freq: Once | ORAL | Status: AC
  Administered 2018-07-01: 25 mg via ORAL
  Filled 2018-06-30: qty 2

## 2018-06-30 MED ORDER — SODIUM CHLORIDE 0.9 % IV BOLUS
500.0000 mL | Freq: Once | INTRAVENOUS | Status: AC
  Administered 2018-07-01: 500 mL via INTRAVENOUS

## 2018-06-30 MED ORDER — ONDANSETRON 4 MG PO TBDP
4.0000 mg | ORAL_TABLET | Freq: Once | ORAL | Status: AC
  Administered 2018-07-01: 4 mg via ORAL
  Filled 2018-06-30: qty 1

## 2018-06-30 NOTE — ED Provider Notes (Signed)
[facility] EMERGENCY DEPARTMENT Provider Note   CSN: [id number] Arrival date & time: 06/30/18  2117     History   Chief Complaint Chief Complaint  Patient presents with  . Near Syncope    HPI Robin Durham is a 62 y.o. female.  Patient with history of morbid obesity, diabetes, hypertension, hyperlipidemia presenting with dizziness and headache for the past 5 days.  States she woke 5 days July 11 with a sensation of room spinning dizziness that is constant as well as a diffuse gradual onset headache.  No thunderclap onset.  Her dizziness is somewhat worse when she changes position but is always somewhat there.  She has not had any nausea or vomiting.  No focal weakness, numbness or tingling.  No difficulty speaking or difficulty swallowing.  Headache is gradual in onset.  There is no photophobia or phonophobia.  Patient saw the PA at her PCPs office on July 12 and was not given a diagnosis.  She comes in today with persistent dizziness worse with position change and lightheadedness as well as ongoing headache.  States her only new medication is Cardizem that was added several weeks ago.  No chest pain or shortness of breath.  The history is provided by the patient.  Near Syncope  Associated symptoms include headaches. Pertinent negatives include no chest pain, no abdominal pain and no shortness of breath.    Past Medical History:  Diagnosis Date  . Borderline diabetic   . Diabetes mellitus without complication (HCC)   . Hyperlipidemia   . Hypertension     Patient Active Problem List   Diagnosis Date Noted  . Dyslipidemia 03/13/2017  . CAD in native artery 03/13/2017  . Abnormal EKG   . Left-sided chest wall pain 01/31/2015  . LVH (left ventricular hypertrophy) 01/31/2015  . Diastolic dysfunction 01/31/2015  . Morbid obesity (HCC) 01/31/2015  . Essential hypertension 01/31/2015    Past Surgical History:  Procedure Laterality Date  . CARDIAC CATHETERIZATION N/A  02/26/2016   Procedure: Left Heart Cath and Coronary Angiography;  Surgeon: Marykay Lex, MD;  Location: [facility] INVASIVE CV LAB;  Service: Cardiovascular;  Laterality: N/A;  . CHOLECYSTECTOMY    . DOPPLER ECHOCARDIOGRAPHY  2007   showed normal LV function and normal LV size  . DOPPLER ECHOCARDIOGRAPHY  02/27/2012   LV EF 55-60%PATTERN OF MODERATE LVH, MODERATE CONCENTRIC HYPERTROPHY; MITRAL VALVE MILDLY TO MODERATELY CALCIFIED ANNULUS- MILD REGURGITATION,LEFT ATRIUM MILDLY DILATED  . HERNIA REPAIR       OB History   None      Home Medications    Prior to Admission medications   Medication Sig Start Date End Date Taking? Authorizing Provider  acetaminophen (TYLENOL) 500 MG tablet Take 1,000 mg by mouth every 6 (six) hours as needed for mild pain or moderate pain. Pain     [provider]  amoxicillin-clavulanate (AUGMENTIN) 875-125 MG tablet Take 1 tablet by mouth 2 (two) times daily. 10 day course starting on 01/21/2017    [provider]  aspirin EC 81 MG tablet Take 81 mg by mouth daily.    [provider]  fluticasone (FLONASE) 50 MCG/ACT nasal spray Place 2 sprays into both nostrils daily.    [provider]  hydrochlorothiazide (MICROZIDE) 12.5 MG capsule Take 12.5 mg by mouth daily.    [provider]  lisinopril (PRINIVIL,ZESTRIL) 20 MG tablet Take 20 mg by mouth daily.    [provider]  loratadine (CLARITIN) 10 MG tablet Take 10  mg by mouth daily.    [provider]  metFORMIN (GLUCOPHAGE) 500 MG tablet Take 500 mg by mouth daily.    [provider]  methocarbamol (ROBAXIN) 500 MG tablet Take 1 tablet (500 mg total) by mouth 2 (two) times daily. Patient not taking: Reported on 03/13/2017 01/28/17   Nira Connardama, Pedro Eduardo, MD  Multiple Vitamins-Minerals (CENTRUM) tablet Take 1 tablet by mouth daily.    [provider]  nebivolol (BYSTOLIC) 10 MG tablet Take 1 tablet (10 mg total) by mouth daily.  03/13/17   Hilty, Lisette AbuKenneth C, MD  potassium chloride (K-DUR) 10 MEQ tablet Take 10 mEq by mouth daily.    [provider]  rosuvastatin (CRESTOR) 40 MG tablet Take 1 tablet (40 mg total) by mouth daily. 03/18/17 06/16/17  Chrystie NoseHilty, Kenneth C, MD  torsemide (DEMADEX) 20 MG tablet Take 20 mg by mouth as needed (edema).     [provider]    Family History Family History  Problem Relation Age of Onset  . Aneurysm Mother   . Heart attack Father   . Heart disease Other   . Stroke Other     Social History Social History   Tobacco Use  . Smoking status: Never Smoker  . Smokeless tobacco: Never Used  Substance Use Topics  . Alcohol use: No  . Drug use: No     Allergies   Patient has no known allergies.   Review of Systems Review of Systems  Constitutional: Negative for activity change, appetite change, fatigue and fever.  HENT: Negative for congestion.   Eyes: Negative for photophobia and visual disturbance.  Respiratory: Negative for cough, chest tightness and shortness of breath.   Cardiovascular: Positive for near-syncope. Negative for chest pain and palpitations.  Gastrointestinal: Negative for abdominal pain, nausea and vomiting.  Genitourinary: Negative for dysuria, hematuria, vaginal bleeding and vaginal discharge.  Musculoskeletal: Negative for back pain.  Neurological: Positive for dizziness, light-headedness and headaches. Negative for speech difficulty and weakness.   all other systems are negative except as noted in the HPI and PMH.     Physical Exam Updated Vital Signs BP (!) 143/68 (BP Location: Right Arm)   Pulse 81   Temp 98.2 F (36.8 C) (Oral)   Resp 18   Ht 5\' 2"  (1.575 m)   Wt 135.2 kg (298 lb)   SpO2 98%   BMI 54.50 kg/m   Physical Exam  Constitutional: She is oriented to person, place, and time. She appears well-developed and well-nourished. No distress.  HENT:  Head: Normocephalic and atraumatic.  Mouth/Throat: Oropharynx is  clear and moist. No oropharyngeal exudate.  Cotton in L ear canal  Eyes: Pupils are equal, round, and reactive to light. Conjunctivae and EOM are normal.  Neck: Normal range of motion. Neck supple.  No meningismus.  Cardiovascular: Normal rate, regular rhythm, normal heart sounds and intact distal pulses.  No murmur heard. Pulmonary/Chest: Effort normal and breath sounds normal. No respiratory distress.  Abdominal: Soft. There is no tenderness. There is no rebound and no guarding.  Musculoskeletal: Normal range of motion. She exhibits no edema or tenderness.  Neurological: She is alert and oriented to person, place, and time. No cranial nerve deficit. She exhibits normal muscle tone. Coordination normal.  CN 2-12 intact, no ataxia on finger to nose, no nystagmus, 5/5 strength throughout, no pronator drift,  Positive Romberg.  Ataxic gait.  No ataxia in finger-to-nose.  Patient with no nystagmus.  Head impulse pulse testing shows no catch-up  saccades.  Test of skew is negative.   Skin: Skin is warm.  Psychiatric: She has a normal mood and affect. Her behavior is normal.  Nursing note and vitals reviewed.    ED Treatments / Results  Labs (all labs ordered are listed, but only abnormal results are displayed) Labs Reviewed  BASIC METABOLIC PANEL - Abnormal; Notable for the following components:      Result Value   Glucose, Bld 135 (*)    BUN 29 (*)    Creatinine, Ser 1.13 (*)    GFR calc non Af Amer 51 (*)    GFR calc Af Amer 59 (*)    All other components within normal limits  CBG MONITORING, ED - Abnormal; Notable for the following components:   Glucose-Capillary 128 (*)    All other components within normal limits  CBC  URINALYSIS, ROUTINE W REFLEX MICROSCOPIC  TROPONIN I  BRAIN NATRIURETIC PEPTIDE  CK    EKG EKG Interpretation  Date/Time:  Tuesday June 30 2018 23:14:00 EDT Ventricular Rate:  74 PR Interval:    QRS Duration: 90 QT Interval:  414 QTC  Calculation: 460 R Axis:   38 Text Interpretation:  Sinus rhythm Borderline T abnormalities, inferior leads  stable T wave changes  No significant change was found Confirmed by Glynn Octave [phone]) on 06/30/2018 11:34:06 PM   Radiology Ct Head Wo Contrast  Result Date: 07/01/2018 CLINICAL DATA:  62 year old female with vertigo. EXAM: CT HEAD WITHOUT CONTRAST TECHNIQUE: Contiguous axial images were obtained from the base of the skull through the vertex without intravenous contrast. COMPARISON:  None. FINDINGS: Brain: Minimal age-related atrophy and chronic microvascular ischemic changes. There is no acute intracranial hemorrhage. No mass effect or midline shift. No extra-axial fluid collection. Vascular: No hyperdense vessel or unexpected calcification. Skull: Normal. Negative for fracture or focal lesion. Sinuses/Orbits: No acute finding. Other: None IMPRESSION: No acute intracranial pathology. Electronically Signed   By: Elgie Collard M.D.   On: 07/01/2018 00:47    Procedures .Foreign Body Removal Date/Time: 07/01/2018 1:49 AM Performed by: Glynn Octave, MD Authorized by: Glynn Octave, MD  Consent: Verbal consent obtained. Risks and benefits: risks, benefits and alternatives were discussed Consent given by: patient Patient understanding: patient states understanding of the procedure being performed Patient consent: the patient's understanding of the procedure matches consent given Patient identity confirmed: verbally with patient and provided demographic data Time out: Immediately prior to procedure a "time out" was called to verify the correct patient, procedure, equipment, support staff and site/side marked as required. Body area: ear Location details: left ear  Sedation: Patient sedated: no  Patient restrained: no Patient cooperative: yes Localization method: visualized, magnification and ENT speculum Removal mechanism: alligator forceps Complexity: simple 1 objects  recovered. Objects recovered: cotton Post-procedure assessment: foreign body removed Patient tolerance: Patient tolerated the procedure well with no immediate complications   (including critical care time)  Medications Ordered in ED Medications  sodium chloride 0.9 % bolus 500 mL (has no administration in time range)  meclizine (ANTIVERT) tablet 25 mg (has no administration in time range)  ondansetron (ZOFRAN-ODT) disintegrating tablet 4 mg (has no administration in time range)     Initial Impression / Assessment and Plan / ED Course  I have reviewed the triage vital signs and the nursing notes.  Pertinent labs & imaging results that were available during my care of the patient were reviewed by me and considered in my medical decision making (see chart for details).  Patient with dizziness and headache for the past 5 days.  Her neurological exam is remarkable for positive Romberg and ataxic gait. she has no nystagmus.  EKG is unchanged. Labs are reassuring.  Orthostatics are negative.  CT head is negative.  Patient does have multiple stroke risk factors.  However she feels improved after meclizine and Zofran and is able to ambulate without assistance.  She has no ataxia.  Patient is tolerating PO. She is able to ambulate without assistance.  Denies headache or dizziness.  Discussed likely peripheral vertigo diagnosis coupled with foreign body in her ear which was removed.  Patient understands that she has multiple stroke risk factors and stroke can only be Ruled out with MRI. MRI not available at this facility at this time.  However do not suspect stroke at this time given her reassuring exam and improvement with symptom control.  Patient feels comfortable with going home without MRI.  States she will return if worse.   Final Clinical Impressions(s) / ED Diagnoses   Final diagnoses:  Dizziness  Foreign body of left ear, initial encounter    ED Discharge Orders    None        Dovid Bartko, Jeannett Senior, MD 07/01/18 [phone]

## 2018-06-30 NOTE — ED Triage Notes (Signed)
Pt c/o feeling like she is going to pass out since Thursday. Pt states she seen her pcp for the same Friday.

## 2018-07-01 DIAGNOSIS — R42 Dizziness and giddiness: Secondary | ICD-10-CM | POA: Diagnosis not present

## 2018-07-01 LAB — URINALYSIS, ROUTINE W REFLEX MICROSCOPIC
Bilirubin Urine: NEGATIVE
GLUCOSE, UA: NEGATIVE mg/dL
Hgb urine dipstick: NEGATIVE
KETONES UR: NEGATIVE mg/dL
LEUKOCYTES UA: NEGATIVE
NITRITE: NEGATIVE
PROTEIN: NEGATIVE mg/dL
Specific Gravity, Urine: 1.023 (ref 1.005–1.030)
pH: 5 (ref 5.0–8.0)

## 2018-07-01 MED ORDER — MECLIZINE HCL 25 MG PO TABS: 25.0000 mg | ORAL_TABLET | Freq: Three times a day (TID) | ORAL | 0 refills | Status: DC | PRN

## 2018-07-01 MED ORDER — ONDANSETRON 4 MG PO TBDP: 4.0000 mg | ORAL_TABLET | Freq: Three times a day (TID) | ORAL | 0 refills | Status: DC | PRN

## 2018-07-01 NOTE — Discharge Instructions (Addendum)
Your exam today is reassuring and your head CT is normal.  As we discussed there is a small chance that we could be missing a stroke which can only be ruled out with MRI.  Take the medication for dizziness as necessary but do not take it if you are working or driving as it may make you sleepy.  Follow up with your doctor. Return to the ED if you develop new or worsening symptoms.

## 2018-07-01 NOTE — ED Notes (Signed)
Patient transported to CT 

## 2019-07-26 ENCOUNTER — Other Ambulatory Visit (HOSPITAL_COMMUNITY): Payer: Self-pay | Admitting: Family Medicine

## 2019-07-26 DIAGNOSIS — Z1231 Encounter for screening mammogram for malignant neoplasm of breast: Secondary | ICD-10-CM

## 2019-07-27 ENCOUNTER — Telehealth: Payer: Self-pay | Admitting: Internal Medicine

## 2019-07-27 NOTE — Telephone Encounter (Signed)
Recall -  lmtcb 07-27-19 °

## 2019-07-28 ENCOUNTER — Encounter (HOSPITAL_COMMUNITY): Payer: Self-pay

## 2019-07-28 ENCOUNTER — Ambulatory Visit (HOSPITAL_COMMUNITY): Payer: PRIVATE HEALTH INSURANCE

## 2019-08-04 ENCOUNTER — Ambulatory Visit (HOSPITAL_COMMUNITY)
Admission: RE | Admit: 2019-08-04 | Discharge: 2019-08-04 | Disposition: A | Discharge status: Home / Self Care | Payer: PRIVATE HEALTH INSURANCE | Source: Ambulatory Visit | Attending: Family Medicine | Admitting: Family Medicine

## 2019-08-04 ENCOUNTER — Other Ambulatory Visit: Payer: Self-pay

## 2019-08-04 ENCOUNTER — Encounter (HOSPITAL_COMMUNITY): Payer: Self-pay

## 2019-08-04 DIAGNOSIS — Z1231 Encounter for screening mammogram for malignant neoplasm of breast: Secondary | ICD-10-CM | POA: Insufficient documentation

## 2019-09-30 ENCOUNTER — Other Ambulatory Visit: Payer: Self-pay

## 2019-09-30 ENCOUNTER — Encounter: Payer: Self-pay | Admitting: Internal Medicine

## 2019-09-30 ENCOUNTER — Ambulatory Visit (INDEPENDENT_AMBULATORY_CARE_PROVIDER_SITE_OTHER): Payer: PRIVATE HEALTH INSURANCE | Admitting: Internal Medicine

## 2019-09-30 VITALS — BP 146/85 | HR 80 | Temp 95.7°F | Ht 62.0 in | Wt 303.8 lb

## 2019-09-30 DIAGNOSIS — I2510 Atherosclerotic heart disease of native coronary artery without angina pectoris: Secondary | ICD-10-CM | POA: Diagnosis not present

## 2019-09-30 DIAGNOSIS — E785 Hyperlipidemia, unspecified: Secondary | ICD-10-CM | POA: Diagnosis not present

## 2019-09-30 MED ORDER — ATORVASTATIN CALCIUM 20 MG PO TABS: 20.0000 mg | ORAL_TABLET | Freq: Every day | ORAL | 3 refills | Status: DC

## 2019-09-30 NOTE — Patient Instructions (Signed)
Medication Instructions:  START atorvastatin 20mg  daily Continue other current medications  *If you need a refill on your cardiac medications before your next appointment, please call your pharmacy*  Lab Work: FASTING lab work in 3 months to check cholesterol   If you have labs (blood work) drawn today and your tests are completely normal, you will receive your results only by: Marland Kitchen MyChart Message (if you have MyChart) OR . A paper copy in the mail If you have any lab test that is abnormal or we need to change your treatment, we will call you to review the results.  Testing/Procedures: NONE  Follow-Up: At [facility], you and your health needs are our priority.  As part of our continuing mission to provide you with exceptional heart care, we have created designated Provider Care Teams.  These Care Teams include your primary Cardiologist (physician) and Advanced Practice Providers (APPs -  Physician Assistants and Nurse Practitioners) who all work together to provide you with the care you need, when you need it.  Your next appointment:   12 months  The format for your next appointment:   In Person  Provider:   You may see Dr. Debara Pickett or one of the following Advanced Practice Providers on your designated Care Team:    Almyra Deforest, PA-C  Fabian Sharp, [place] or   Roby Lofts, [place]   Other Instructions

## 2019-09-30 NOTE — Progress Notes (Signed)
OFFICE NOTE  Chief Complaint:  No complaints  Primary Care Physician: Sharilyn Sites, MD  HPI:  Robin Durham  Is a pleasant 63 year old female previously seen by Dr. Rollene Fare in 2013 for cardiomegaly. She does have a history of hypertension, dyslipidemia, and obesity  As well as diabetes. She had undergone stress testing which was negative for ischemia in the past. She also had an echocardiogram in 2013 which shows moderate LVH, grade 2 diastolic dysfunction , mild to moderate mitral annular calcification with mild regurgitation and mild left atrial enlargement.  She recently been describing some left upper chest and shoulder discomfort. She says it feels that her soreness in her chest that it's worse when pushing on. She describes it as a 1 or 2 out of 10 and it is constant , in fact it is been constant for about 3 months. Is not improved improved with rest or worsened with exertion.  She does have a history of a left shoulder problem and wonders if it could be related.  Ms. Robin Durham returns today for follow-up. She continues to have left-sided chest pain. She's had stress testing in the past which was negative for ischemia but it's been a number of years ago. Some of her symptoms sounded atypical and recommended nonsteroidals however that does not help. She's been more short of breath recently. Also she has an upper respiratory infection for the past several days. Echo as mentioned ready showed some grade 2 diastolic dysfunction and her blood pressure is not at goal.  Robin Durham returns today for follow-up. She was referred for cardiac catheterization performed by Dr. Ellyn Hack. The results are as follows:  1. Dist LAD lesion, 30% stenosed. Otherwise no significant CAD. 2. The left ventricular systolic function is normal. 3. Normal LVEDP  Likely false-positive nuclear stress test. Would investigate non-coronary etiology for the patient's symptoms.   She is relieved by those  findings however still has intermittent left upper chest and shoulder pain. This could be musculoskeletal or orthopedic. I've encouraged her to follow-up with her primary care provider about this. Again I encouraged significant weight loss which may be helpful improving her symptoms. Being a diabetic and the fact that there was mild coronary artery disease, I am recommending she start on aspirin 81 mg daily. It would also be beneficial for her to be on a beta blocker in addition to her ace inhibitor. As her blood pressure is well-controlled I would recommend discontinuing amlodipine and changing her over to Toprol-XL 25 mg daily.  03/13/2017  Robin Durham returns for follow-up. Over the past year she denies any worsening shortness of breath or chest pain. She has had some headache which was probably related to Toprol-XL and cause her to discontinue the medicine. Surprisingly her blood pressure today was elevated 191/114. I did recheck her blood pressure which came out 160/90. She said she did not taken her lisinopril today, and previously her blood pressures been well controlled, however it seems to be much more elevated. I asked her what her blood pressure readings were at home and she said they average about 376-283 systolic, which would still be higher than goal. Weight has increased from 305-307 since I last saw her. She reports that she seems to sleep well at night although has been told she snores in the past. Her husband who is deceased had sleep apnea and wore mask she's never had a sleep study. She's considering whether she wants to have a sleep study but not  at this time. Had a recent lipid profile and is on pravastatin.  09/30/2019  Robin Durham seen today for follow-up.  Overall she is doing well denies any chest pain or worsening shortness of breath.  She has managed to lose a few pounds since we saw her last but remains over 300 pounds.  She is considering bariatric surgery.  Her most  recent lipid profile in June showed total cholesterol 174, triglycerides 142, HDL 54 and LDL 92.  She was previously on pravastatin but for some reason this patient was discontinued.  I do think she needs to be on statin therapy with a target LDL less than 70 given her coronary disease.  PMHx:  Past Medical History:  Diagnosis Date  . Borderline diabetic   . Diabetes mellitus without complication (HCC)   . Hyperlipidemia   . Hypertension     Past Surgical History:  Procedure Laterality Date  . CARDIAC CATHETERIZATION N/A 02/26/2016   Procedure: Left Heart Cath and Coronary Angiography;  Surgeon: Marykay Lexavid W Harding, MD;  [facility]AS[facility] CV LAB;  Service: Cardiovascular;  Laterality: N/A;  . CHOLECYSTECTOMY    . DOPPLER ECHOCARDIOGRAPHY  2007   showed normal LV function and normal LV size  . DOPPLER ECHOCARDIOGRAPHY  02/27/2012   LV EF 55-60%PATTERN OF MODERATE LVH, MODERATE CONCENTRIC HYPERTROPHY; MITRAL VALVE MILDLY TO MODERATELY CALCIFIED ANNULUS- MILD REGURGITATION,LEFT ATRIUM MILDLY DILATED  . HERNIA REPAIR      FAMHx:  Family History  Problem Relation Age of Onset  . Aneurysm Mother   . Heart attack Father   . Heart disease Other   . Stroke Other     SOCHx:   reports that she has never smoked. She has never used smokeless tobacco. She reports that she does not drink alcohol or use drugs.  ALLERGIES:  No Known Allergies  ROS: Pertinent items noted in HPI and remainder of comprehensive ROS otherwise negative.  HOME MEDS: Current Outpatient Medications  Medication Sig Dispense Refill  . aspirin EC 81 MG tablet Take 81 mg by mouth daily.    . fluticasone (FLONASE) 50 MCG/ACT nasal spray Place 2 sprays into both nostrils daily.    . metoprolol succinate (TOPROL-XL) 25 MG 24 hr tablet TK 1 T PO D FOR 30 DAYS    . Multiple Vitamins-Minerals (CENTRUM) tablet Take 1 tablet by mouth daily.    . potassium chloride (K-DUR) 10 MEQ tablet Take 10 mEq by mouth daily.     No  current facility-administered medications for this visit.     LABS/IMAGING: No results found for this or any previous visit (from the past 48 hour(s)). No results found.  VITALS: BP (!) 146/85   Pulse 80   Temp (!) 95.7 F (35.4 C)   Ht 5\' 2"  (1.575 m)   Wt (!) 303 lb 12.8 oz (137.8 kg)   SpO2 92%   BMI 55.57 kg/m   EXAM: General appearance: alert, no distress and morbidly obese Neck: no carotid bruit and no JVD Lungs: clear to auscultation bilaterally Heart: regular rate and rhythm, S1, S2 normal, no murmur, click, rub or gallop Abdomen: soft, non-tender; bowel sounds normal; no masses,  no organomegaly Extremities: extremities normal, atraumatic, no cyanosis or edema Pulses: 2+ and symmetric Skin: Skin color, texture, turgor normal. No rashes or lesions Neurologic: Grossly normal PSych: Pleasant  EKG: Normal sinus rhythm 76, no voltage criteria for LVH, nonspecific T wave changes-personally reviewed  ASSESSMENT: 1. Chest wall pain - mild, non-obstructive single vessel  LAD -CAD on cath (02/2016) 2. Morbid obesity 3. Moderate LVH with Stage 2 diastolic dysfunction and left atrial enlargement, preserved LVEF 55% 4. Hypertension 5. Dyslipidemia 6. DM2  PLAN: 1.   Robin Durham continues to do well unfortunately has not lost the [place] out of weight.  She is a good candidate for bariatric surgery without any significant obstructive coronary disease on cath in 2017.  She needs continued strict medical therapy and for some reason her statin was discontinued.  Her target LDL is less than 70 and I recommend atorvastatin 20 mg daily.  We will plan to repeat lipid assessment in 3 months and otherwise follow-up with me annually or sooner as necessary.  Cardiac standpoint within the next year she would be at acceptable risk for bariatric surgery.  Robin Nose, MD, [facility], FACP  [place]  [facility]  Medical Director of the Advanced Lipid Disorders &  Cardiovascular  Risk Reduction Clinic Diplomate of the American Board of Clinical Lipidology Attending Cardiologist  Direct Dial: [phone]  Fax: [phone]  Website:  [email or web address] 09/30/2019, 2:35 PM

## 2020-01-26 ENCOUNTER — Ambulatory Visit: Payer: PRIVATE HEALTH INSURANCE | Attending: Internal Medicine

## 2020-01-26 ENCOUNTER — Other Ambulatory Visit: Payer: Self-pay

## 2020-01-26 DIAGNOSIS — Z20822 Contact with and (suspected) exposure to covid-19: Secondary | ICD-10-CM

## 2020-01-27 LAB — NOVEL CORONAVIRUS, NAA: SARS-CoV-2, NAA: NOT DETECTED

## 2020-02-26 ENCOUNTER — Ambulatory Visit: Payer: PRIVATE HEALTH INSURANCE | Attending: Internal Medicine

## 2020-02-26 DIAGNOSIS — Z23 Encounter for immunization: Secondary | ICD-10-CM

## 2020-02-26 NOTE — Progress Notes (Signed)
   Covid-19 Vaccination Clinic  Name:  Robin Durham    MRN: [id number] DOB: 04-17-1956  02/26/2020  [name] was observed post Covid-19 immunization for 15 minutes without incident. She was provided with Vaccine Information Sheet and instruction to access the V-Safe system.   [name] was instructed to call 911 with any severe reactions post vaccine: Marland Kitchen Difficulty breathing  . Swelling of face and throat  . A fast heartbeat  . A bad rash all over body  . Dizziness and weakness   Immunizations Administered    Name Date Dose VIS Date Route   Moderna COVID-19 Vaccine 02/26/2020 10:55 AM 0.5 mL 11/16/2019 Intramuscular   Manufacturer: Moderna   Lot: [id number]   NDC: 33545-625-63

## 2020-03-29 ENCOUNTER — Ambulatory Visit: Payer: PRIVATE HEALTH INSURANCE | Attending: Internal Medicine

## 2020-03-29 DIAGNOSIS — Z23 Encounter for immunization: Secondary | ICD-10-CM

## 2020-03-29 NOTE — Progress Notes (Signed)
   Covid-19 Vaccination Clinic  Name:  Robin Durham    MRN: [id number] DOB: 18-Apr-1956  03/29/2020  [name] was observed post Covid-19 immunization for 15 minutes without incident. She was provided with Vaccine Information Sheet and instruction to access the V-Safe system.   Ms. Slight was instructed to call 911 with any severe reactions post vaccine: Marland Kitchen Difficulty breathing  . Swelling of face and throat  . A fast heartbeat  . A bad rash all over body  . Dizziness and weakness   Immunizations Administered    Name Date Dose VIS Date Route   Moderna COVID-19 Vaccine 03/29/2020 10:50 AM 0.5 mL 11/16/2019 Intramuscular   Manufacturer: Moderna   Lot: [id number]   NDC: 82800-349-17

## 2020-09-30 LAB — LIPID PANEL
Chol/HDL Ratio: 3.2 ratio (ref 0.0–4.4)
Cholesterol, Total: 167 mg/dL (ref 100–199)
HDL: 52 mg/dL (ref >39)
LDL Chol Calc (NIH): 91 mg/dL (ref 0–99)
Triglycerides: 140 mg/dL (ref 0–149)
VLDL Cholesterol Cal: 24 mg/dL (ref 5–40)

## 2020-10-02 ENCOUNTER — Encounter: Payer: Self-pay | Admitting: Internal Medicine

## 2020-10-27 ENCOUNTER — Encounter: Payer: Self-pay | Admitting: Internal Medicine

## 2020-10-27 ENCOUNTER — Ambulatory Visit (INDEPENDENT_AMBULATORY_CARE_PROVIDER_SITE_OTHER): Payer: Medicare Other | Admitting: Internal Medicine

## 2020-10-27 DIAGNOSIS — E785 Hyperlipidemia, unspecified: Secondary | ICD-10-CM | POA: Diagnosis not present

## 2020-10-27 DIAGNOSIS — I2510 Atherosclerotic heart disease of native coronary artery without angina pectoris: Secondary | ICD-10-CM | POA: Diagnosis not present

## 2020-10-27 DIAGNOSIS — I10 Essential (primary) hypertension: Secondary | ICD-10-CM | POA: Diagnosis not present

## 2020-10-27 MED ORDER — ATORVASTATIN CALCIUM 40 MG PO TABS: 40.0000 mg | ORAL_TABLET | Freq: Every day | ORAL | 3 refills | Status: DC

## 2020-10-27 NOTE — Patient Instructions (Addendum)
Medication Instructions:  Your physician has recommended you make the following change in your medication: INCREASE atorvastatin to 40mg  daily  *If you need a refill on your cardiac medications before your next appointment, please call your pharmacy*   Lab Work: FASTING lipid panel in 3 months  If you have labs (blood work) drawn today and your tests are completely normal, you will receive your results only by: MyChart Message (if you have MyChart) OR . A paper copy in the mail If you have any lab test that is abnormal or we need to change your treatment, we will call you to review the results.   Testing/Procedures: NONE   Follow-Up: At [facility], you and your health needs are our priority.  As part of our continuing mission to provide you with exceptional heart care, we have created designated Provider Care Teams.  These Care Teams include your primary Cardiologist (physician) and Advanced Practice Providers (APPs -  Physician Assistants and Nurse Practitioners) who all work together to provide you with the care you need, when you need it.  We recommend signing up for the patient portal called "MyChart".  Sign up information is provided on this After Visit Summary.  MyChart is used to connect with patients for Virtual Visits (Telemedicine).  Patients are able to view lab/test results, encounter notes, upcoming appointments, etc.  Non-urgent messages can be sent to your provider as well.   To learn more about what you can do with MyChart, go to [facility].    Your next appointment:   12 months  The format for your next appointment:   In Person  Provider:   You may see Dr. [email or web address] or one of the following Advanced Practice Providers on your designated Care Team:    Rennis Golden, PA-C  Azalee Course, PA-C or   Micah Flesher, Judy Pimple    Other Instructions You have been referred to Dr. [place]

## 2020-10-27 NOTE — Progress Notes (Signed)
OFFICE NOTE  Chief Complaint:  No complaints  Primary Care Physician: Sharilyn Sites, MD  HPI:  Robin Durham  Is a pleasant 64 year old female previously seen by Dr. Rollene Fare in 2013 for cardiomegaly. She does have a history of hypertension, dyslipidemia, and obesity  As well as diabetes. She had undergone stress testing which was negative for ischemia in the past. She also had an echocardiogram in 2013 which shows moderate LVH, grade 2 diastolic dysfunction , mild to moderate mitral annular calcification with mild regurgitation and mild left atrial enlargement.  She recently been describing some left upper chest and shoulder discomfort. She says it feels that her soreness in her chest that it's worse when pushing on. She describes it as a 1 or 2 out of 10 and it is constant , in fact it is been constant for about 3 months. Is not improved improved with rest or worsened with exertion.  She does have a history of a left shoulder problem and wonders if it could be related.  Ms. Robin Durham returns today for follow-up. She continues to have left-sided chest pain. She's had stress testing in the past which was negative for ischemia but it's been a number of years ago. Some of her symptoms sounded atypical and recommended nonsteroidals however that does not help. She's been more short of breath recently. Also she has an upper respiratory infection for the past several days. Echo as mentioned ready showed some grade 2 diastolic dysfunction and her blood pressure is not at goal.  Mrs. Robin Durham returns today for follow-up. She was referred for cardiac catheterization performed by Dr. Ellyn Hack. The results are as follows:  1. Dist LAD lesion, 30% stenosed. Otherwise no significant CAD. 2. The left ventricular systolic function is normal. 3. Normal LVEDP  Likely false-positive nuclear stress test. Would investigate non-coronary etiology for the patient's symptoms.   She is relieved by those  findings however still has intermittent left upper chest and shoulder pain. This could be musculoskeletal or orthopedic. I've encouraged her to follow-up with her primary care provider about this. Again I encouraged significant weight loss which may be helpful improving her symptoms. Being a diabetic and the fact that there was mild coronary artery disease, I am recommending she start on aspirin 81 mg daily. It would also be beneficial for her to be on a beta blocker in addition to her ace inhibitor. As her blood pressure is well-controlled I would recommend discontinuing amlodipine and changing her over to Toprol-XL 25 mg daily.  03/13/2017  Robin Durham returns for follow-up. Over the past year she denies any worsening shortness of breath or chest pain. She has had some headache which was probably related to Toprol-XL and cause her to discontinue the medicine. Surprisingly her blood pressure today was elevated 191/114. I did recheck her blood pressure which came out 160/90. She said she did not taken her lisinopril today, and previously her blood pressures been well controlled, however it seems to be much more elevated. I asked her what her blood pressure readings were at home and she said they average about 376-283 systolic, which would still be higher than goal. Weight has increased from 305-307 since I last saw her. She reports that she seems to sleep well at night although has been told she snores in the past. Her husband who is deceased had sleep apnea and wore mask she's never had a sleep study. She's considering whether she wants to have a sleep study but not  at this time. Had a recent lipid profile and is on pravastatin.  09/30/2019  Robin Durham seen today for follow-up.  Overall she is doing well denies any chest pain or worsening shortness of breath.  She has managed to lose a few pounds since we saw her last but remains over 300 pounds.  She is considering bariatric surgery.  Her most  recent lipid profile in June showed total cholesterol 174, triglycerides 142, HDL 54 and LDL 92.  She was previously on pravastatin but for some reason this patient was discontinued.  I do think she needs to be on statin therapy with a target LDL less than 70 given her coronary disease.  10/27/2020  Robin Durham returns for follow-up. Overall she is doing well. Unfortunately she has had some significant weight gain. Weight now up to three hundred twenty-two from three oh three. We had talked before about weight management options and possible bariatric surgery which was not pursued. EKG is normal today. Her cholesterol is better but LDL still remains above target at 91. I recommended further increase in her statin.  PMHx:  Past Medical History:  Diagnosis Date  . Borderline diabetic   . Diabetes mellitus without complication (HCC)   . Hyperlipidemia   . Hypertension     Past Surgical History:  Procedure Laterality Date  . CARDIAC CATHETERIZATION N/A 02/26/2016   Procedure: Left Heart Cath and Coronary Angiography;  Surgeon: Marykay Lex, MD;  Location: [facility] INVASIVE CV LAB;  Service: Cardiovascular;  Laterality: N/A;  . CHOLECYSTECTOMY    . DOPPLER ECHOCARDIOGRAPHY  2007   showed normal LV function and normal LV size  . DOPPLER ECHOCARDIOGRAPHY  02/27/2012   LV EF 55-60%PATTERN OF MODERATE LVH, MODERATE CONCENTRIC HYPERTROPHY; MITRAL VALVE MILDLY TO MODERATELY CALCIFIED ANNULUS- MILD REGURGITATION,LEFT ATRIUM MILDLY DILATED  . HERNIA REPAIR      FAMHx:  Family History  Problem Relation Age of Onset  . Aneurysm Mother   . Heart attack Father   . Heart disease Other   . Stroke Other     SOCHx:   reports that she has never smoked. She has never used smokeless tobacco. She reports that she does not drink alcohol and does not use drugs.  ALLERGIES:  No Known Allergies  ROS: Pertinent items noted in HPI and remainder of comprehensive ROS otherwise negative.  HOME  MEDS: Current Outpatient Medications  Medication Sig Dispense Refill  . albuterol (VENTOLIN HFA) 108 (90 Base) MCG/ACT inhaler Inhale into the lungs.    Marland Kitchen aspirin EC 81 MG tablet Take 81 mg by mouth daily.    . fluticasone (FLONASE) 50 MCG/ACT nasal spray Place 2 sprays into both nostrils daily.    . metoprolol succinate (TOPROL-XL) 25 MG 24 hr tablet TK 1 T PO D FOR 30 DAYS    . Multiple Vitamins-Minerals (CENTRUM) tablet Take 1 tablet by mouth daily.    . potassium chloride (K-DUR) 10 MEQ tablet Take 10 mEq by mouth daily.    Chelsea Aus ER 240 MG 24 hr capsule Take by mouth in the morning and at bedtime.    . valsartan-hydrochlorothiazide (DIOVAN-HCT) 160-25 MG tablet Take 1 tablet by mouth daily.    Marland Kitchen atorvastatin (LIPITOR) 20 MG tablet Take 1 tablet (20 mg total) by mouth daily. 90 tablet 3  . ibuprofen (ADVIL) 800 MG tablet Take 800 mg by mouth as needed.     No current facility-administered medications for this visit.    LABS/IMAGING: No results found for  this or any previous visit (from the past 48 hour(s)). No results found.  VITALS: BP 140/72 (BP Location: Left Arm, Patient Position: Sitting)   Pulse 78   Ht 5\' 3"  (1.6 m)   Wt (!) 322 lb 9.6 oz (146.3 kg)   SpO2 92%   BMI 57.15 kg/m   EXAM: General appearance: alert, no distress and morbidly obese Neck: no carotid bruit and no JVD Lungs: clear to auscultation bilaterally Heart: regular rate and rhythm, S1, S2 normal, no murmur, click, rub or gallop Abdomen: soft, non-tender; bowel sounds normal; no masses,  no organomegaly Extremities: extremities normal, atraumatic, no cyanosis or edema Pulses: 2+ and symmetric Skin: Skin color, texture, turgor normal. No rashes or lesions Neurologic: Grossly normal PSych: Pleasant  EKG: Normal sinus rhythm at seventy-six, moderate voltage criteria for LVH-personally reviewed  ASSESSMENT: 1. Chest wall pain - mild, non-obstructive single vessel LAD -CAD on cath  (02/2016) 2. Morbid obesity 3. Moderate LVH with Stage 2 diastolic dysfunction and left atrial enlargement, preserved LVEF 55% 4. Hypertension 5. Dyslipidemia 6. DM2  PLAN: 1.   Mrs. Beckers continues to struggle with weight. I have offered referral to the weight management center and she is interested. In addition her cholesterol remains above target I recommend increasing her Lipitor further to 40 mg daily. Repeat lipids in 3 months. Follow-up with me annually or sooner as necessary.  08-21-1988, MD, [facility], FACP  [place]  [facility]  Medical Director of the Advanced Lipid Disorders &  Cardiovascular Risk Reduction Clinic Diplomate of the American Board of Clinical Lipidology Attending Cardiologist  Direct Dial: [phone]  Fax: [phone]  Website:  [email or web address]  [phone] Takesha Steger 10/27/2020, 2:12 PM

## 2021-02-23 DIAGNOSIS — M25562 Pain in left knee: Secondary | ICD-10-CM | POA: Diagnosis not present

## 2021-07-24 DIAGNOSIS — M5410 Radiculopathy, site unspecified: Secondary | ICD-10-CM | POA: Diagnosis not present

## 2021-07-24 DIAGNOSIS — I2510 Atherosclerotic heart disease of native coronary artery without angina pectoris: Secondary | ICD-10-CM | POA: Diagnosis not present

## 2021-07-24 DIAGNOSIS — E118 Type 2 diabetes mellitus with unspecified complications: Secondary | ICD-10-CM | POA: Diagnosis not present

## 2021-07-24 DIAGNOSIS — M1991 Primary osteoarthritis, unspecified site: Secondary | ICD-10-CM | POA: Diagnosis not present

## 2021-07-24 DIAGNOSIS — E782 Mixed hyperlipidemia: Secondary | ICD-10-CM | POA: Diagnosis not present

## 2021-07-24 DIAGNOSIS — I10 Essential (primary) hypertension: Secondary | ICD-10-CM | POA: Diagnosis not present

## 2021-07-24 DIAGNOSIS — H9192 Unspecified hearing loss, left ear: Secondary | ICD-10-CM | POA: Diagnosis not present

## 2021-12-25 DIAGNOSIS — H52203 Unspecified astigmatism, bilateral: Secondary | ICD-10-CM | POA: Diagnosis not present

## 2021-12-25 DIAGNOSIS — H25813 Combined forms of age-related cataract, bilateral: Secondary | ICD-10-CM | POA: Diagnosis not present

## 2021-12-25 DIAGNOSIS — H524 Presbyopia: Secondary | ICD-10-CM | POA: Diagnosis not present

## 2021-12-25 DIAGNOSIS — E1136 Type 2 diabetes mellitus with diabetic cataract: Secondary | ICD-10-CM | POA: Diagnosis not present

## 2021-12-25 DIAGNOSIS — Z7984 Long term (current) use of oral hypoglycemic drugs: Secondary | ICD-10-CM | POA: Diagnosis not present

## 2022-02-25 ENCOUNTER — Encounter (HOSPITAL_COMMUNITY): Payer: Self-pay | Admitting: Emergency Medicine

## 2022-02-25 ENCOUNTER — Other Ambulatory Visit: Payer: Self-pay

## 2022-02-25 ENCOUNTER — Emergency Department (HOSPITAL_COMMUNITY): Payer: PRIVATE HEALTH INSURANCE

## 2022-02-25 ENCOUNTER — Emergency Department (HOSPITAL_COMMUNITY)
Admission: EM | Admit: 2022-02-25 | Discharge: 2022-02-25 | Disposition: A | Discharge status: Home / Self Care | Payer: PRIVATE HEALTH INSURANCE | Attending: Emergency Medicine | Admitting: Emergency Medicine

## 2022-02-25 ENCOUNTER — Ambulatory Visit (HOSPITAL_COMMUNITY)
Admission: EM | Admit: 2022-02-25 | Discharge: 2022-02-25 | Disposition: A | Discharge status: Home / Self Care | Payer: PRIVATE HEALTH INSURANCE | Attending: Nurse Practitioner | Admitting: Nurse Practitioner

## 2022-02-25 DIAGNOSIS — Z7982 Long term (current) use of aspirin: Secondary | ICD-10-CM | POA: Insufficient documentation

## 2022-02-25 DIAGNOSIS — M79605 Pain in left leg: Secondary | ICD-10-CM | POA: Diagnosis not present

## 2022-02-25 DIAGNOSIS — M25512 Pain in left shoulder: Secondary | ICD-10-CM | POA: Insufficient documentation

## 2022-02-25 DIAGNOSIS — R531 Weakness: Secondary | ICD-10-CM | POA: Diagnosis not present

## 2022-02-25 DIAGNOSIS — J329 Chronic sinusitis, unspecified: Secondary | ICD-10-CM | POA: Diagnosis not present

## 2022-02-25 DIAGNOSIS — R200 Anesthesia of skin: Secondary | ICD-10-CM | POA: Diagnosis not present

## 2022-02-25 DIAGNOSIS — I10 Essential (primary) hypertension: Secondary | ICD-10-CM

## 2022-02-25 DIAGNOSIS — R519 Headache, unspecified: Secondary | ICD-10-CM | POA: Diagnosis present

## 2022-02-25 LAB — BASIC METABOLIC PANEL
Anion gap: 12 (ref 5–15)
BUN: 19 mg/dL (ref 8–23)
CO2: 26 mmol/L (ref 22–32)
Calcium: 9.5 mg/dL (ref 8.9–10.3)
Chloride: 99 mmol/L (ref 98–111)
Creatinine, Ser: 0.89 mg/dL (ref 0.44–1.00)
GFR, Estimated: >60 mL/min (ref >60)
Glucose, Bld: 161 mg/dL — ABNORMAL HIGH (ref 70–99)
Potassium: 3.7 mmol/L (ref 3.5–5.1)
Sodium: 137 mmol/L (ref 135–145)

## 2022-02-25 LAB — CBC WITH DIFFERENTIAL/PLATELET
Abs Immature Granulocytes: 0.03 10*3/uL (ref 0.00–0.07)
Basophils Absolute: 0.1 10*3/uL (ref 0.0–0.1)
Basophils Relative: 1 %
Eosinophils Absolute: 0.3 10*3/uL (ref 0.0–0.5)
Eosinophils Relative: 2 %
HCT: 39.3 % (ref 36.0–46.0)
Hemoglobin: 13.2 g/dL (ref 12.0–15.0)
Immature Granulocytes: 0 %
Lymphocytes Relative: 23 %
Lymphs Abs: 2.7 10*3/uL (ref 0.7–4.0)
MCH: 34.1 pg — ABNORMAL HIGH (ref 26.0–34.0)
MCHC: 33.6 g/dL (ref 30.0–36.0)
MCV: 101.6 fL — ABNORMAL HIGH (ref 80.0–100.0)
Monocytes Absolute: 0.9 10*3/uL (ref 0.1–1.0)
Monocytes Relative: 8 %
Neutro Abs: 7.7 10*3/uL (ref 1.7–7.7)
Neutrophils Relative %: 66 %
Platelets: 367 10*3/uL (ref 150–400)
RBC: 3.87 MIL/uL (ref 3.87–5.11)
RDW: 12.7 % (ref 11.5–15.5)
WBC: 11.7 10*3/uL — ABNORMAL HIGH (ref 4.0–10.5)
nRBC: 0 % (ref 0.0–0.2)

## 2022-02-25 LAB — CBG MONITORING, ED: Glucose-Capillary: 169 mg/dL — ABNORMAL HIGH (ref 70–99)

## 2022-02-25 NOTE — ED Notes (Signed)
Engineer, mining phone with no answer. Will re-attempt.  ?

## 2022-02-25 NOTE — ED Triage Notes (Signed)
Patient BIB [facility] for evaluation of subjective left sided weakness. Patient ambulated from room at urgent care out to [facility] stretcher independently with steady gait. No facial droop, grip strength equal bilaterally, no arm drift. Patient alert, oriented, and in no apparent distress at this time. ?

## 2022-02-25 NOTE — Discharge Instructions (Addendum)
Patient discharged to the emergency department for evaluation and work-up. ?

## 2022-02-25 NOTE — ED Notes (Signed)
Spoke with Alycia Rossetti, RN at [facility] ED and made aware patient will be coming via [facility] ?

## 2022-02-25 NOTE — ED Triage Notes (Signed)
Pt reports since Saturday noticed left face, arm and leg become weaker.  ?Pt had left ear pains for about 2-3 months  ?

## 2022-02-25 NOTE — ED Provider Triage Note (Signed)
Emergency Medicine Provider Triage Evaluation Note ? ?Robin Durham , a 66 y.o. female  was evaluated in triage.  Pt complains of left arm pain/shoulder pain and weakness. Sent from urgent care due to concern for stroke. Has had URI sxs and is set to f/u with ent. States her face was 'quivering' on the left side today and her left arm feels weak. ? ?Review of Systems  ?Positive: Arm pain/weakness ?Negative: Chest pain, sob ? ?Physical Exam  ?BP (!) 155/67 (BP Location: Right Arm)   Pulse 88   Temp (!) 97.5 ?F (36.4 ?C) (Oral)   Resp 16   SpO2 98%  ?Gen:   Awake, no distress   ?Resp:  Normal effort  ?MSK:   Moves extremities without difficulty  ?Other:  TTP to the left chest wall and left trapezius which reproduces pain. 5/5 strength to the bue/ble, no sensory changes appreciated. Clear speech, no facial droop ? ?Medical Decision Making  ?Medically screening exam initiated at 3:28 PM.  Appropriate orders placed.  PAKOU RAINBOW was informed that the remainder of the evaluation will be completed by another provider, this initial triage assessment does not replace that evaluation, and the importance of remaining in the ED until their evaluation is complete. ? ? ?  ?Karrie Meres, PA-C ?02/25/22 1531 ? ?

## 2022-02-25 NOTE — ED Notes (Signed)
Patient is being discharged from the Urgent Care and sent to the Emergency Department via [facility] . Per Devra Dopp, NP, patient is in need of higher level of care due to left sided weakness and hypertension. Patient is aware and verbalizes understanding of plan of care.  ?Vitals:  ? 02/25/22 1310  ?BP: (!) 189/92  ?Pulse: 84  ?Resp: (!) 22  ?Temp: 98.2 ?F (36.8 ?C)  ?SpO2: 95%  ?  ?

## 2022-02-25 NOTE — ED Notes (Signed)
Pt verbalized understanding of d/c instructions, meds, and followup care. Denies questions. VSS, no distress noted. Steady gait to exit with all belongings.  ?

## 2022-02-25 NOTE — ED Provider Notes (Signed)
[facility]    CSN: [id number] Arrival date & time: 02/25/22  1038      History   Chief Complaint Chief Complaint  Patient presents with   Numbness    HPI Robin Durham is a 66 y.o. female.   The patient is a 66 year old female who presents with left-sided weakness and numbness.  Patient states "something does not feel right".  Patient stated her symptoms started about 2-3 days ago.  She states she was seen by ENT on Friday for follow-up ear infection.  She states that she has numbness in the left side of her face that runs down into her left shoulder, left upper extremity, and left lower body.  She she also complains of dizziness and headache.  She denies fever, chills, chest pain, nausea, vomiting.  She is a diabetic and states that she is only eaten crackers today.  She has a history of CAD, hypertension, DM and hyperlipidemia, with a family history of stroke.     Past Medical History:  Diagnosis Date   Borderline diabetic    Diabetes mellitus without complication (HCC)    Hyperlipidemia    Hypertension     Patient Active Problem List   Diagnosis Date Noted   Dyslipidemia 03/13/2017   CAD in native artery 03/13/2017   Abnormal EKG    Left-sided chest wall pain 01/31/2015   LVH (left ventricular hypertrophy) 01/31/2015   Diastolic dysfunction 01/31/2015   Morbid obesity (HCC) 01/31/2015   Essential hypertension 01/31/2015    Past Surgical History:  Procedure Laterality Date   CARDIAC CATHETERIZATION N/A 02/26/2016   Procedure: Left Heart Cath and Coronary Angiography;  Surgeon: Marykay Lex, MD;  Location: [facility] INVASIVE CV LAB;  Service: Cardiovascular;  Laterality: N/A;   CHOLECYSTECTOMY     DOPPLER ECHOCARDIOGRAPHY  2007   showed normal LV function and normal LV size   DOPPLER ECHOCARDIOGRAPHY  02/27/2012   LV EF 55-60%PATTERN OF MODERATE LVH, MODERATE CONCENTRIC HYPERTROPHY; MITRAL VALVE MILDLY TO MODERATELY CALCIFIED ANNULUS- MILD  REGURGITATION,LEFT ATRIUM MILDLY DILATED   HERNIA REPAIR      OB History   No obstetric history on file.      Home Medications    Prior to Admission medications   Medication Sig Start Date End Date Taking? Authorizing Provider  albuterol (VENTOLIN HFA) 108 (90 Base) MCG/ACT inhaler Inhale into the lungs. 08/17/20   [provider]  aspirin EC 81 MG tablet Take 81 mg by mouth daily.    [provider]  atorvastatin (LIPITOR) 40 MG tablet Take 1 tablet (40 mg total) by mouth daily. 10/27/20 01/25/21  Chrystie Nose, MD  fluticasone (FLONASE) 50 MCG/ACT nasal spray Place 2 sprays into both nostrils daily.    [provider]  ibuprofen (ADVIL) 800 MG tablet Take 800 mg by mouth as needed. 09/05/20   [provider]  metoprolol succinate (TOPROL-XL) 25 MG 24 hr tablet TK 1 T PO D FOR 30 DAYS 08/27/19   [provider]  Multiple Vitamins-Minerals (CENTRUM) tablet Take 1 tablet by mouth daily.    [provider]  potassium chloride (K-DUR) 10 MEQ tablet Take 10 mEq by mouth daily.    [provider]  TIADYLT ER 240 MG 24 hr capsule Take by mouth in the morning and at bedtime. 09/03/20   [provider]  valsartan-hydrochlorothiazide (DIOVAN-HCT) 160-25 MG tablet Take 1 tablet by mouth daily. 09/08/20   [provider]    Family History Family  History  Problem Relation Age of Onset   Aneurysm Mother    Heart attack Father    Heart disease Other    Stroke Other     Social History Social History   Tobacco Use   Smoking status: Never   Smokeless tobacco: Never  Substance Use Topics   Alcohol use: No   Drug use: No     Allergies   Patient has no known allergies.   Review of Systems Review of Systems  Constitutional: Negative.   HENT: Negative.    Eyes:  Positive for visual disturbance.  Respiratory:  Positive for shortness of breath.   Cardiovascular: Negative.   Gastrointestinal: Negative.    Skin: Negative.   Neurological:  Positive for dizziness, weakness, light-headedness, numbness and headaches. Negative for facial asymmetry.  Psychiatric/Behavioral: Negative.      Physical Exam Triage Vital Signs ED Triage Vitals  Enc Vitals Group     BP 02/25/22 1310 (!) 189/92     Pulse Rate 02/25/22 1310 84     Resp 02/25/22 1310 (!) 22     Temp 02/25/22 1310 98.2 F (36.8 C)     Temp Source 02/25/22 1310 Oral     SpO2 02/25/22 1310 95 %     Weight --      Height --      Head Circumference --      Peak Flow --      Pain Score 02/25/22 1308 10     Pain Loc --      Pain Edu? --      Excl. in GC? --    Orthostatic VS for the past 24 hrs:  BP- Lying Pulse- Lying BP- Sitting Pulse- Sitting BP- Standing at 0 minutes Pulse- Standing at 0 minutes  02/25/22 1418 150/80 81 149/85 82 (!) 161/99 83    Updated Vital Signs BP (!) 189/92 (BP Location: Right Arm)    Pulse 84    Temp 98.2 F (36.8 C) (Oral)    Resp (!) 22    SpO2 95%   Visual Acuity Right Eye Distance:   Left Eye Distance:   Bilateral Distance:    Right Eye Near:   Left Eye Near:    Bilateral Near:     Physical Exam Vitals reviewed.  Constitutional:      Appearance: Normal appearance. She is obese. She is ill-appearing.  HENT:     Head: Normocephalic and atraumatic.     Right Ear: Tympanic membrane, ear canal and external ear normal.     Left Ear: Tympanic membrane, ear canal and external ear normal.     Nose: Nose normal.     Mouth/Throat:     Mouth: Mucous membranes are moist.  Eyes:     Extraocular Movements: Extraocular movements intact.     Conjunctiva/sclera: Conjunctivae normal.     Pupils: Pupils are equal, round, and reactive to light.  Cardiovascular:     Rate and Rhythm: Normal rate and regular rhythm.     Pulses: Normal pulses.     Heart sounds: Normal heart sounds.  Pulmonary:     Effort: Pulmonary effort is normal.     Breath sounds: Normal breath sounds.  Abdominal:     General:  Bowel sounds are normal.     Palpations: Abdomen is soft.  Musculoskeletal:     Cervical back: Normal range of motion. No tenderness.  Lymphadenopathy:     Cervical: No cervical adenopathy.  Skin:    General: Skin  is warm and dry.  Neurological:     Mental Status: She is alert and oriented to person, place, and time.     GCS: GCS eye subscore is 4. GCS verbal subscore is 5. GCS motor subscore is 6.     Cranial Nerves: No cranial nerve deficit or facial asymmetry.     Motor: Weakness and pronator drift present.     Coordination: Coordination abnormal.     Gait: Gait abnormal.  Psychiatric:        Mood and Affect: Mood normal.        Behavior: Behavior normal.     UC Treatments / Results  Labs (all labs ordered are listed, but only abnormal results are displayed) Labs Reviewed  CBG MONITORING, ED - Abnormal; Notable for the following components:      Result Value   Glucose-Capillary 169 (*)    All other components within normal limits    EKG Nonspecific T wave abnormality seen on EKG, compared to EKG dated 11/05/20  Radiology No results found.  Procedures Procedures (including critical care time)  Medications Ordered in UC Medications - No data to display  Initial Impression / Assessment and Plan / UC Course  I have reviewed the triage vital signs and the nursing notes.  Pertinent labs & imaging results that were available during my care of the patient were reviewed by me and considered in my medical decision making (see chart for details).  Patient presents with left-sided numbness and weakness.  Symptoms have been present for the past 2 days.  Patient with left facial numbness,left upper extremity numbness, positive pronator drift, abnormal coordination and abnormal gait on exam. Patient's glucose was 169.  Orthostatic vital signs done.  Difficult to determine patient's etiology relating to her left side numbness and other neurological symptoms in this setting.   Patient to be transported to the emergency department to allow for the appropriate work-up and evaluation.  Patient is aware and is willing to follow this recommendation. Final Clinical Impressions(s) / UC Diagnoses   Final diagnoses:  None   Discharge Instructions   None    ED Prescriptions   None    PDMP not reviewed this encounter.   Abran Cantor, NP 02/25/22 1451

## 2022-02-25 NOTE — ED Notes (Signed)
[facility] notified about transport needed to ED for patient.  ?

## 2022-02-25 NOTE — Discharge Instructions (Signed)
Your symptoms appear to be caused by sinusitis, which is called "chronic."  To treat that we recommend using Flonase nasal spray, 1 spray in each nostril, twice a day for 1 month.  Also use Afrin 1 spray each nostril twice a day for 1 month.  Call the ENT doctor for a follow-up appointment to be seen as soon as possible.  Take Tylenol, every 4 hours for pain. ?

## 2022-02-25 NOTE — ED Notes (Signed)
[facility] here to transport pt to ED.  

## 2022-02-25 NOTE — ED Provider Notes (Incomplete)
[facility]PART[facility] Provider Note   CSN: [id number] Arrival date & time: 02/25/22  1501     History {Add pertinent medical, surgical, social history, OB history to HPI:1} Chief Complaint  Patient presents with   Weakness    Robin Durham is a 67 y.o. female.  HPI Patient presents after going to an urgent care for evaluation of a sensation of facial discomfort and headache.  She also has pain in her left shoulder and upper arm.  She is currently being treated for an ear infection, and feels like it has not gotten better.  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  She was seen and treated for left ear infection with antibiotic recently.  Her facial discomfort is described as occasional quivering, and is only on the left side of her face.  She has a bilateral headache which been present for 4 days.  She denies trauma.  She denies fever, chills, nausea or vomiting.  She tells me that she does not have any numbness or paresthesia.  She does not have any focal weakness.  She is able to ambulate.  She states that her left leg has been painful, for about a month.  She is not sure why.  She works a job doing patient care.  Home Medications Prior to Admission medications   Medication Sig Start Date End Date Taking? Authorizing Provider  albuterol (VENTOLIN HFA) 108 (90 Base) MCG/ACT inhaler Inhale into the lungs. 08/17/20   [provider]  aspirin EC 81 MG tablet Take 81 mg by mouth daily.    [provider]  atorvastatin (LIPITOR) 40 MG tablet Take 1 tablet (40 mg total) by mouth daily. 10/27/20 01/25/21  Pixie Casino, MD  fluticasone (FLONASE) 50 MCG/ACT nasal spray Place 2 sprays into both nostrils daily.    [provider]  ibuprofen (ADVIL) 800 MG tablet Take 800 mg by mouth as needed. 09/05/20   [provider]  metoprolol succinate (TOPROL-XL) 25 MG 24 hr tablet TK 1 T PO D FOR 30 DAYS 08/27/19    [provider]  Multiple Vitamins-Minerals (CENTRUM) tablet Take 1 tablet by mouth daily.    [provider]  potassium chloride (K-DUR) 10 MEQ tablet Take 10 mEq by mouth daily.    [provider]  TIADYLT ER 240 MG 24 hr capsule Take by mouth in the morning and at bedtime. 09/03/20   [provider]  valsartan-hydrochlorothiazide (DIOVAN-HCT) 160-25 MG tablet Take 1 tablet by mouth daily. 09/08/20   [provider]      Allergies    Patient has no known allergies.    Review of Systems   Review of Systems  Physical Exam Updated Vital Signs BP (!) 155/67 (BP Location: Right Arm)    Pulse 88    Temp (!) 97.5 F (36.4 C) (Oral)    Resp 16    SpO2 98%  Physical Exam  ED Results / Procedures / Treatments   Labs (all labs ordered are listed, but only abnormal results are displayed) Labs Reviewed  CBC WITH DIFFERENTIAL/PLATELET  BASIC METABOLIC PANEL    EKG None  Radiology No results found.  Procedures Procedures  {Document cardiac monitor, telemetry assessment procedure when appropriate:1}  Medications Ordered in ED Medications - No data to display  ED Course/ Medical Decision Making/ A&P Clinical Course as of 02/25/22 2051  Mon Feb 25, 2022  2049 Findings discussed with the patient  she is agreeable to treatment for chronic sinusitis with Flonase and Afrin.  Will refer to ENT [EW]    Clinical Course User Index [EW] Daleen Bo, MD                           Medical Decision Making Patient presenting for headache, discomfort of her left face which comes and goes and general malaise.  She does not have any acute neurologic symptoms that are persistent.  She is amatory came here by [employer] transfer from the urgent care.  Amount and/or Complexity of Data Reviewed Independent Historian:     Details: She is a cogent historian External Data Reviewed: notes.    Details: Evaluation notes from today when she was briefly seen at  the urgent care and referred here for further more advanced work-up that was not available at their facility. Labs: ordered.    Details: CBC, metabolic panel Radiology: ordered.    Details: Chest x-ray, CT head ECG/medicine tests: ordered.    Details: Cardiac monitor     {Document critical care time when appropriate:1} {Document review of labs and clinical decision tools ie heart score, Chads2Vasc2 etc:1}  {Document your independent review of radiology images, and any outside records:1} {Document your discussion with family members, caretakers, and with consultants:1} {Document social determinants of health affecting pt's care:1} {Document your decision making why or why not admission, treatments were needed:1} Final Clinical Impression(s) / ED Diagnoses Final diagnoses:  None    Rx / DC Orders ED Discharge Orders     None

## 2022-03-08 DIAGNOSIS — E7849 Other hyperlipidemia: Secondary | ICD-10-CM | POA: Diagnosis not present

## 2022-03-08 DIAGNOSIS — E118 Type 2 diabetes mellitus with unspecified complications: Secondary | ICD-10-CM | POA: Diagnosis not present

## 2022-03-13 DIAGNOSIS — J342 Deviated nasal septum: Secondary | ICD-10-CM | POA: Diagnosis not present

## 2022-03-13 DIAGNOSIS — J324 Chronic pansinusitis: Secondary | ICD-10-CM | POA: Diagnosis not present

## 2022-03-13 DIAGNOSIS — J343 Hypertrophy of nasal turbinates: Secondary | ICD-10-CM | POA: Diagnosis not present

## 2022-03-13 DIAGNOSIS — H9042 Sensorineural hearing loss, unilateral, left ear, with unrestricted hearing on the contralateral side: Secondary | ICD-10-CM | POA: Diagnosis not present

## 2022-04-03 DIAGNOSIS — J320 Chronic maxillary sinusitis: Secondary | ICD-10-CM | POA: Diagnosis not present

## 2022-04-03 DIAGNOSIS — J343 Hypertrophy of nasal turbinates: Secondary | ICD-10-CM | POA: Diagnosis not present

## 2022-04-03 DIAGNOSIS — J342 Deviated nasal septum: Secondary | ICD-10-CM | POA: Diagnosis not present

## 2022-04-04 ENCOUNTER — Other Ambulatory Visit: Payer: Self-pay | Admitting: Otolaryngology

## 2022-04-04 DIAGNOSIS — J329 Chronic sinusitis, unspecified: Secondary | ICD-10-CM

## 2022-04-29 ENCOUNTER — Ambulatory Visit
Admission: RE | Admit: 2022-04-29 | Discharge: 2022-04-29 | Disposition: A | Discharge status: Home / Self Care | Payer: Medicare Other | Source: Ambulatory Visit | Attending: Otolaryngology | Admitting: Otolaryngology

## 2022-04-29 DIAGNOSIS — J3489 Other specified disorders of nose and nasal sinuses: Secondary | ICD-10-CM | POA: Diagnosis not present

## 2022-04-29 DIAGNOSIS — M2669 Other specified disorders of temporomandibular joint: Secondary | ICD-10-CM | POA: Diagnosis not present

## 2022-04-29 DIAGNOSIS — J329 Chronic sinusitis, unspecified: Secondary | ICD-10-CM

## 2022-04-29 DIAGNOSIS — J320 Chronic maxillary sinusitis: Secondary | ICD-10-CM | POA: Diagnosis not present

## 2022-05-02 DIAGNOSIS — J343 Hypertrophy of nasal turbinates: Secondary | ICD-10-CM | POA: Diagnosis not present

## 2022-05-02 DIAGNOSIS — J342 Deviated nasal septum: Secondary | ICD-10-CM | POA: Diagnosis not present

## 2022-05-02 DIAGNOSIS — J324 Chronic pansinusitis: Secondary | ICD-10-CM | POA: Diagnosis not present

## 2022-05-02 DIAGNOSIS — J338 Other polyp of sinus: Secondary | ICD-10-CM | POA: Diagnosis not present

## 2022-05-10 ENCOUNTER — Other Ambulatory Visit: Payer: Self-pay | Admitting: Otolaryngology

## 2022-07-02 DIAGNOSIS — J069 Acute upper respiratory infection, unspecified: Secondary | ICD-10-CM | POA: Diagnosis not present

## 2022-09-04 NOTE — Progress Notes (Signed)
Surgical Instructions    Your procedure is scheduled on Wednesday September 29th.  Report to [facility] Main Entrance "A" at 11:30 A.M., then check in with the Admitting office.  Call this number if you have problems the morning of surgery:  [phone]   If you have any questions prior to your surgery date call [phone]: Open Monday-Friday 8am-4pm    Remember:  Do not eat or drink after midnight the night before your surgery     Take these medicines the morning of surgery with A SIP OF WATER: cyclobenzaprine (FLEXERIL) 10 MG tablet fluticasone (FLONASE) 50 MCG/ACT nasal spray metoprolol succinate (TOPROL-XL) 25 MG 24 hr tablet montelukast (SINGULAIR) 10 MG tablet  IF NEEDED  acetaminophen (TYLENOL) 325 MG tablet albuterol (VENTOLIN HFA) 108 (90 Base) MCG/ACT inhaler - Please bring with you to the hospital Artificial Tear Ointment (DRY EYES OP)   Follow your surgeon's instructions on when to stop Aspirin.  If no instructions were given by your surgeon then you will need to call the office to get those instructions.    As of today, STOP taking any Aspirin (unless otherwise instructed by your surgeon) CELEBREX, Aleve, Naproxen, Ibuprofen, Motrin, Advil, Goody's, BC's, all herbal medications, fish oil, and all vitamins.  WHAT DO I DO ABOUT MY DIABETES MEDICATION?   Do not take oral diabetes medicines (Metformin) the morning of surgery.   The day of surgery, do not take other diabetes injectables, including Byetta (exenatide), Bydureon (exenatide ER), Victoza (liraglutide), or Trulicity (dulaglutide).    HOW TO MANAGE YOUR DIABETES BEFORE AND AFTER SURGERY  Why is it important to control my blood sugar before and after surgery? Improving blood sugar levels before and after surgery helps healing and can limit problems. A way of improving blood sugar control is eating a healthy diet by:  Eating less sugar and carbohydrates  Increasing activity/exercise  Talking with  your doctor about reaching your blood sugar goals High blood sugars (greater than 180 mg/dL) can raise your risk of infections and slow your recovery, so you will need to focus on controlling your diabetes during the weeks before surgery. Make sure that the doctor who takes care of your diabetes knows about your planned surgery including the date and location.  How do I manage my blood sugar before surgery? Check your blood sugar at least 4 times a day, starting 2 days before surgery, to make sure that the level is not too high or low.  Check your blood sugar the morning of your surgery when you wake up and every 2 hours until you get to the Short Stay unit.  If your blood sugar is less than 70 mg/dL, you will need to treat for low blood sugar: Do not take insulin. Treat a low blood sugar (less than 70 mg/dL) with  cup of clear juice (cranberry or apple), 4 glucose tablets, OR glucose gel. Recheck blood sugar in 15 minutes after treatment (to make sure it is greater than 70 mg/dL). If your blood sugar is not greater than 70 mg/dL on recheck, call [phone] for further instructions. Report your blood sugar to the short stay nurse when you get to Short Stay.  If you are admitted to the hospital after surgery: Your blood sugar will be checked by the staff and you will probably be given insulin after surgery (instead of oral diabetes medicines) to make sure you have good blood sugar levels. The goal for blood sugar control after surgery is 80-180 mg/dL.  PREPARING FOR SURGERY          Do not wear jewelry or makeup. Do not wear lotions, powders, perfumes or deodorant. Do not shave 48 hours prior to surgery.   Do not bring valuables to the hospital. Do not wear nail polish, gel polish, artificial nails, or any other type of covering on natural nails (fingers and toes) If you have artificial nails or gel coating that need to be removed by a nail salon, please have this removed prior to  surgery. Artificial nails or gel coating may interfere with anesthesia's ability to adequately monitor your vital signs.  [facility] is not responsible for any belongings or valuables.    Do NOT Smoke (Tobacco/Vaping)  24 hours prior to your procedure  If you use a CPAP at night, you may bring your mask for your overnight stay.   Contacts, glasses, hearing aids, dentures or partials may not be worn into surgery, please bring cases for these belongings   For patients admitted to the hospital, discharge time will be determined by your treatment team.   Patients discharged the day of surgery will not be allowed to drive home, and someone needs to stay with them for 24 hours.   SURGICAL WAITING ROOM VISITATION Patients having surgery or a procedure may have no more than 2 support people in the waiting area - these visitors may rotate.   Children under the age of 58 must have an adult with them who is not the patient. If the patient needs to stay at the hospital during part of their recovery, the visitor guidelines for inpatient rooms apply. Pre-op nurse will coordinate an appropriate time for 1 support person to accompany patient in pre-op.  This support person may not rotate.   Please refer to the [facility] website for the visitor guidelines for Inpatients (after your surgery is over and you are in a regular room).    Special instructions:    Oral Hygiene is also important to reduce your risk of infection.  Remember - BRUSH YOUR TEETH THE MORNING OF SURGERY WITH YOUR REGULAR TOOTHPASTE   [facility]- Preparing For Surgery  Before surgery, you can play an important role. Because skin is not sterile, your skin needs to be as free of germs as possible. You can reduce the number of germs on your skin by washing with CHG (chlorahexidine gluconate) Soap before surgery.  CHG is an antiseptic cleaner which kills germs and bonds with the skin to continue killing germs even after washing.      Please do not use if you have an allergy to CHG or antibacterial soaps. If your skin becomes reddened/irritated stop using the CHG.  Do not shave (including legs and underarms) for at least 48 hours prior to first CHG shower. It is OK to shave your face.  Please follow these instructions carefully.     Shower the NIGHT BEFORE SURGERY and the MORNING OF SURGERY with CHG Soap.   If you chose to wash your hair, wash your hair first as usual with your normal shampoo. After you shampoo, rinse your hair and body thoroughly to remove the shampoo.  Then [employer] and genitals (private parts) with your normal soap and rinse thoroughly to remove soap.  After that Use CHG Soap as you would any other liquid soap. You can apply CHG directly to the skin and wash gently with a scrungie or a clean washcloth.   Apply the CHG Soap to your body ONLY  FROM THE NECK DOWN.  Do not use on open wounds or open sores. Avoid contact with your eyes, ears, mouth and genitals (private parts). Wash Face and genitals (private parts)  with your normal soap.   Wash thoroughly, paying special attention to the area where your surgery will be performed.  Thoroughly rinse your body with warm water from the neck down.  DO NOT shower/wash with your normal soap after using and rinsing off the CHG Soap.  Pat yourself dry with a CLEAN TOWEL.  Wear CLEAN PAJAMAS to bed the night before surgery  Place CLEAN SHEETS on your bed the night before your surgery  DO NOT SLEEP WITH PETS.   Day of Surgery:  Take a shower with CHG soap. Wear Clean/Comfortable clothing the morning of surgery Do not apply any deodorants/lotions.   Remember to brush your teeth WITH YOUR REGULAR TOOTHPASTE.    If you received a COVID test during your pre-op visit, it is requested that you wear a mask when out in public, stay away from anyone that may not be feeling well, and notify your surgeon if you develop symptoms. If you have been in contact  with anyone that has tested positive in the last 10 days, please notify your surgeon.    Please read over the following fact sheets that you were given.

## 2022-09-05 ENCOUNTER — Encounter (HOSPITAL_COMMUNITY)
Admission: RE | Admit: 2022-09-05 | Discharge: 2022-09-05 | Disposition: A | Discharge status: Home / Self Care | Payer: Medicare Other | Source: Ambulatory Visit | Attending: Otolaryngology | Admitting: Otolaryngology

## 2022-09-05 ENCOUNTER — Other Ambulatory Visit (HOSPITAL_COMMUNITY): Payer: PRIVATE HEALTH INSURANCE

## 2022-09-05 ENCOUNTER — Other Ambulatory Visit: Payer: Self-pay

## 2022-09-05 ENCOUNTER — Encounter (HOSPITAL_COMMUNITY): Payer: Self-pay

## 2022-09-05 VITALS — BP 158/87 | HR 91 | Temp 98.0°F | Resp 19 | Ht 63.0 in | Wt 292.2 lb

## 2022-09-05 DIAGNOSIS — Z01812 Encounter for preprocedural laboratory examination: Secondary | ICD-10-CM | POA: Diagnosis not present

## 2022-09-05 DIAGNOSIS — Z01818 Encounter for other preprocedural examination: Secondary | ICD-10-CM

## 2022-09-05 LAB — BASIC METABOLIC PANEL
Anion gap: 4 — ABNORMAL LOW (ref 5–15)
BUN: 21 mg/dL (ref 8–23)
CO2: 28 mmol/L (ref 22–32)
Calcium: 9.4 mg/dL (ref 8.9–10.3)
Chloride: 107 mmol/L (ref 98–111)
Creatinine, Ser: 1.02 mg/dL — ABNORMAL HIGH (ref 0.44–1.00)
GFR, Estimated: >60 mL/min (ref >60)
Glucose, Bld: 107 mg/dL — ABNORMAL HIGH (ref 70–99)
Potassium: 4.3 mmol/L (ref 3.5–5.1)
Sodium: 139 mmol/L (ref 135–145)

## 2022-09-05 LAB — GLUCOSE, CAPILLARY: Glucose-Capillary: 118 mg/dL — ABNORMAL HIGH (ref 70–99)

## 2022-09-05 LAB — CBC
HCT: 39.5 % (ref 36.0–46.0)
Hemoglobin: 12.6 g/dL (ref 12.0–15.0)
MCH: 32.2 pg (ref 26.0–34.0)
MCHC: 31.9 g/dL (ref 30.0–36.0)
MCV: 101 fL — ABNORMAL HIGH (ref 80.0–100.0)
Platelets: 378 10*3/uL (ref 150–400)
RBC: 3.91 MIL/uL (ref 3.87–5.11)
RDW: 13.1 % (ref 11.5–15.5)
WBC: 9.5 10*3/uL (ref 4.0–10.5)
nRBC: 0 % (ref 0.0–0.2)

## 2022-09-05 LAB — HEMOGLOBIN A1C
Hgb A1c MFr Bld: 7.1 % — ABNORMAL HIGH (ref 4.8–5.6)
Mean Plasma Glucose: 157.07 mg/dL

## 2022-09-05 NOTE — Progress Notes (Signed)
PCP - Sharilyn Sites, MD Cardiologist - Lyman Bishop MD  PPM/ICD - Denies Device Orders -  Rep Notified -   Chest x-ray - 02/25/22 EKG - 02/25/22 Stress Test - 02/14/16 ECHO - 02/27/12 Cardiac Cath - 02/26/16  Sleep Study - denies CPAP -   Fasting Blood Sugar - 125 Checks Blood Sugar once a week  Blood Thinner Instructions:na Aspirin Instructions: pt states per her surgeon's instructions her last dose of aspirin will be 09/06/22.  ERAS Protcol -no PRE-SURGERY Ensure or G2-   COVID TEST- na   Anesthesia review:   Patient denies shortness of breath, fever, cough and chest pain at PAT appointment   All instructions explained to the patient, with a verbal understanding of the material. Patient agrees to go over the instructions while at home for a better understanding. Patient also instructed to self quarantine after being tested for COVID-19. The opportunity to ask questions was provided.

## 2022-09-05 NOTE — Progress Notes (Signed)
Surgical Instructions    Your procedure is scheduled on Friday September 29th.  Report to [facility] Main Entrance "A" at 11:30 A.M., then check in with the Admitting office.  Call this number if you have problems the morning of surgery:  [phone]   If you have any questions prior to your surgery date call [phone]: Open Monday-Friday 8am-4pm    Remember:  Do not eat or drink after midnight the night before your surgery     Take these medicines the morning of surgery with A SIP OF WATER: cyclobenzaprine (FLEXERIL) 10 MG tablet fluticasone (FLONASE) 50 MCG/ACT nasal spray metoprolol succinate (TOPROL-XL) 25 MG 24 hr tablet montelukast (SINGULAIR) 10 MG tablet  IF NEEDED  acetaminophen (TYLENOL) 325 MG tablet albuterol (VENTOLIN HFA) 108 (90 Base) MCG/ACT inhaler - Please bring with you to the hospital Artificial Tear Ointment (DRY EYES OP)   Follow your surgeon's instructions on when to stop Aspirin.  If no instructions were given by your surgeon then you will need to call the office to get those instructions.    As of today, STOP taking any Aspirin (unless otherwise instructed by your surgeon) CELEBREX, Aleve, Naproxen, Ibuprofen, Motrin, Advil, Goody's, BC's, all herbal medications, fish oil, and all vitamins.  WHAT DO I DO ABOUT MY DIABETES MEDICATION?   Do not take oral diabetes medicines (Metformin) the morning of surgery.   HOW TO MANAGE YOUR DIABETES BEFORE AND AFTER SURGERY  Why is it important to control my blood sugar before and after surgery? Improving blood sugar levels before and after surgery helps healing and can limit problems. A way of improving blood sugar control is eating a healthy diet by:  Eating less sugar and carbohydrates  Increasing activity/exercise  Talking with your doctor about reaching your blood sugar goals High blood sugars (greater than 180 mg/dL) can raise your risk of infections and slow your recovery, so you will need to focus  on controlling your diabetes during the weeks before surgery. Make sure that the doctor who takes care of your diabetes knows about your planned surgery including the date and location.  How do I manage my blood sugar before surgery? Check your blood sugar at least 4 times a day, starting 2 days before surgery, to make sure that the level is not too high or low.  Check your blood sugar the morning of your surgery when you wake up and every 2 hours until you get to the Short Stay unit.  If your blood sugar is less than 70 mg/dL, you will need to treat for low blood sugar: Do not take insulin. Treat a low blood sugar (less than 70 mg/dL) with  cup of clear juice (cranberry or apple), 4 glucose tablets, OR glucose gel. Recheck blood sugar in 15 minutes after treatment (to make sure it is greater than 70 mg/dL). If your blood sugar is not greater than 70 mg/dL on recheck, call [phone] for further instructions. Report your blood sugar to the short stay nurse when you get to Short Stay.  If you are admitted to the hospital after surgery: Your blood sugar will be checked by the staff and you will probably be given insulin after surgery (instead of oral diabetes medicines) to make sure you have good blood sugar levels. The goal for blood sugar control after surgery is 80-180 mg/dL.   PREPARING FOR SURGERY          Do not wear jewelry or makeup. Do not wear lotions, powders, perfumes  or deodorant. Do not shave 48 hours prior to surgery.   Do not bring valuables to the hospital. Do not wear nail polish, gel polish, artificial nails, or any other type of covering on natural nails (fingers and toes) If you have artificial nails or gel coating that need to be removed by a nail salon, please have this removed prior to surgery. Artificial nails or gel coating may interfere with anesthesia's ability to adequately monitor your vital signs.  [facility] is not responsible for any belongings or  valuables.    Do NOT Smoke (Tobacco/Vaping)  24 hours prior to your procedure  If you use a CPAP at night, you may bring your mask for your overnight stay.   Contacts, glasses, hearing aids, dentures or partials may not be worn into surgery, please bring cases for these belongings   For patients admitted to the hospital, discharge time will be determined by your treatment team.   Patients discharged the day of surgery will not be allowed to drive home, and someone needs to stay with them for 24 hours.   SURGICAL WAITING ROOM VISITATION Patients having surgery or a procedure may have no more than 2 support people in the waiting area - these visitors may rotate.   Children under the age of 26 must have an adult with them who is not the patient. If the patient needs to stay at the hospital during part of their recovery, the visitor guidelines for inpatient rooms apply. Pre-op nurse will coordinate an appropriate time for 1 support person to accompany patient in pre-op.  This support person may not rotate.   Please refer to the [facility] website for the visitor guidelines for Inpatients (after your surgery is over and you are in a regular room).    Special instructions:    Oral Hygiene is also important to reduce your risk of infection.  Remember - BRUSH YOUR TEETH THE MORNING OF SURGERY WITH YOUR REGULAR TOOTHPASTE   [facility]- Preparing For Surgery  Before surgery, you can play an important role. Because skin is not sterile, your skin needs to be as free of germs as possible. You can reduce the number of germs on your skin by washing with CHG (chlorahexidine gluconate) Soap before surgery.  CHG is an antiseptic cleaner which kills germs and bonds with the skin to continue killing germs even after washing.     Please do not use if you have an allergy to CHG or antibacterial soaps. If your skin becomes reddened/irritated stop using the CHG.  Do not shave (including legs and underarms)  for at least 48 hours prior to first CHG shower. It is OK to shave your face.  Please follow these instructions carefully.     Shower the NIGHT BEFORE SURGERY and the MORNING OF SURGERY with CHG Soap.   If you chose to wash your hair, wash your hair first as usual with your normal shampoo. After you shampoo, rinse your hair and body thoroughly to remove the shampoo.  Then [employer] and genitals (private parts) with your normal soap and rinse thoroughly to remove soap.  After that Use CHG Soap as you would any other liquid soap. You can apply CHG directly to the skin and wash gently with a scrungie or a clean washcloth.   Apply the CHG Soap to your body ONLY FROM THE NECK DOWN.  Do not use on open wounds or open sores. Avoid contact with your eyes, ears, mouth and genitals (private  parts). Wash Face and genitals (private parts)  with your normal soap.   Wash thoroughly, paying special attention to the area where your surgery will be performed.  Thoroughly rinse your body with warm water from the neck down.  DO NOT shower/wash with your normal soap after using and rinsing off the CHG Soap.  Pat yourself dry with a CLEAN TOWEL.  Wear CLEAN PAJAMAS to bed the night before surgery  Place CLEAN SHEETS on your bed the night before your surgery  DO NOT SLEEP WITH PETS.   Day of Surgery:  Take a shower with CHG soap. Wear Clean/Comfortable clothing the morning of surgery Do not apply any deodorants/lotions.   Remember to brush your teeth WITH YOUR REGULAR TOOTHPASTE.    If you received a COVID test during your pre-op visit, it is requested that you wear a mask when out in public, stay away from anyone that may not be feeling well, and notify your surgeon if you develop symptoms. If you have been in contact with anyone that has tested positive in the last 10 days, please notify your surgeon.    Please read over the following fact sheets that you were given.

## 2022-09-12 NOTE — Anesthesia Preprocedure Evaluation (Addendum)
Anesthesia Evaluation  Patient identified by MRN, date of birth, ID band Patient awake    Reviewed: Allergy & Precautions, NPO status , Patient's Chart, lab work & pertinent test results  History of Anesthesia Complications Negative for: history of anesthetic complications  Airway Mallampati: III  TM Distance: >3 FB Neck ROM: Full    Dental no notable dental hx. (+) Dental Advisory Given   Pulmonary asthma    Pulmonary exam normal        Cardiovascular hypertension, Pt. on medications Normal cardiovascular exam     Neuro/Psych negative neurological ROS     GI/Hepatic negative GI ROS, Neg liver ROS,,,  Endo/Other  diabetes  Morbid obesity  Renal/GU negative Renal ROS     Musculoskeletal negative musculoskeletal ROS (+)    Abdominal   Peds  Hematology negative hematology ROS (+)   Anesthesia Other Findings   Reproductive/Obstetrics                             Anesthesia Physical Anesthesia Plan  ASA: 3  Anesthesia Plan: General   Post-op Pain Management: Tylenol PO (pre-op)*   Induction: Intravenous  PONV Risk Score and Plan: 4 or greater and Ondansetron, Dexamethasone, Midazolam and Scopolamine patch - Pre-op  Airway Management Planned: Oral ETT  Additional Equipment:   Intra-op Plan:   Post-operative Plan: Extubation in OR  Informed Consent: I have reviewed the patients History and Physical, chart, labs and discussed the procedure including the risks, benefits and alternatives for the proposed anesthesia with the patient or authorized representative who has indicated his/her understanding and acceptance.     Dental advisory given  Plan Discussed with: Anesthesiologist and CRNA  Anesthesia Plan Comments: (PAT note by Karoline Caldwell, PA-C: Patient previously evaluated by cardiology for chest wall pain.  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  Recommend investigation of noncoronary etiology for patient's symptoms.  Risk factor reduction recommended.  History of asthma, maintained on Singulair and as needed albuterol.  Non insulin dependent DM2, A1c 7.1 on preop labs.  Preop labs reviewed, unremarkable.   EKG 02/25/22: Normal sinus rhythm. Rate 81. Nonspecific T wave abnormality  Cath 02/26/2016: 1. Dist LAD lesion, 30% stenosed. Otherwise no significant CAD. 2. The left ventricular systolic function is normal. 3. Normal LVEDP   Likely false-positive nuclear stress test.  Would investigate non-coronary etiology for the patient's symptoms.  PLAN: . The Rad-stat band will be removed in the PACU holding area protocol, then the patient was transferred to the Short Stay Unit. Marland Kitchen She was ready for discharge as soon as bed rest is over.  )        Anesthesia Quick Evaluation

## 2022-09-12 NOTE — Progress Notes (Signed)
Anesthesia Chart Review:  Patient previously evaluated by cardiology for chest wall pain.  Ultimately had cath 02/2016 showed mild nonobstructive single-vessel disease (30% distal LAD).  Recommend investigation of noncoronary etiology for patient's symptoms.  Risk factor reduction recommended.  History of asthma, maintained on Singulair and as needed albuterol.  Non insulin dependent DM2, A1c 7.1 on preop labs.  Preop labs reviewed, unremarkable.   EKG 02/25/22: Normal sinus rhythm. Rate 81. Nonspecific T wave abnormality  Cath 02/26/2016: Dist LAD lesion, 30% stenosed. Otherwise no significant CAD. The left ventricular systolic function is normal. Normal LVEDP     Likely false-positive nuclear stress test.   Would investigate non-coronary etiology for the patient's symptoms.   PLAN: The Rad-stat band will be removed in the PACU holding area protocol, then the patient was transferred to the Short Stay Unit. She was ready for discharge as soon as bed rest is over.   Wynonia Musty [facility] Short Stay Center/Anesthesiology Phone [phone] 09/12/2022 4:40 PM

## 2022-09-13 ENCOUNTER — Other Ambulatory Visit: Payer: Self-pay

## 2022-09-13 ENCOUNTER — Encounter (HOSPITAL_COMMUNITY)
Admission: RE | Disposition: A | Discharge status: Home / Self Care | Payer: Self-pay | Source: Ambulatory Visit | Attending: Otolaryngology

## 2022-09-13 ENCOUNTER — Encounter (HOSPITAL_COMMUNITY): Payer: Self-pay | Admitting: Otolaryngology

## 2022-09-13 ENCOUNTER — Ambulatory Visit (HOSPITAL_BASED_OUTPATIENT_CLINIC_OR_DEPARTMENT_OTHER): Payer: Medicare Other | Admitting: Physician Assistant

## 2022-09-13 ENCOUNTER — Ambulatory Visit (HOSPITAL_COMMUNITY)
Admission: RE | Admit: 2022-09-13 | Discharge: 2022-09-13 | Disposition: A | Discharge status: Home / Self Care | Payer: Medicare Other | Source: Ambulatory Visit | Attending: Otolaryngology | Admitting: Otolaryngology

## 2022-09-13 ENCOUNTER — Ambulatory Visit (HOSPITAL_COMMUNITY): Payer: Medicare Other | Admitting: Physician Assistant

## 2022-09-13 DIAGNOSIS — B49 Unspecified mycosis: Secondary | ICD-10-CM | POA: Diagnosis not present

## 2022-09-13 DIAGNOSIS — J45909 Unspecified asthma, uncomplicated: Secondary | ICD-10-CM | POA: Diagnosis not present

## 2022-09-13 DIAGNOSIS — Z01818 Encounter for other preprocedural examination: Secondary | ICD-10-CM

## 2022-09-13 DIAGNOSIS — J324 Chronic pansinusitis: Secondary | ICD-10-CM | POA: Insufficient documentation

## 2022-09-13 DIAGNOSIS — Z6841 Body Mass Index (BMI) 40.0 and over, adult: Secondary | ICD-10-CM | POA: Insufficient documentation

## 2022-09-13 DIAGNOSIS — E119 Type 2 diabetes mellitus without complications: Secondary | ICD-10-CM | POA: Insufficient documentation

## 2022-09-13 DIAGNOSIS — J338 Other polyp of sinus: Secondary | ICD-10-CM | POA: Diagnosis not present

## 2022-09-13 DIAGNOSIS — I10 Essential (primary) hypertension: Secondary | ICD-10-CM | POA: Insufficient documentation

## 2022-09-13 DIAGNOSIS — J342 Deviated nasal septum: Secondary | ICD-10-CM | POA: Insufficient documentation

## 2022-09-13 DIAGNOSIS — J309 Allergic rhinitis, unspecified: Secondary | ICD-10-CM | POA: Diagnosis not present

## 2022-09-13 DIAGNOSIS — J343 Hypertrophy of nasal turbinates: Secondary | ICD-10-CM | POA: Insufficient documentation

## 2022-09-13 DIAGNOSIS — J323 Chronic sphenoidal sinusitis: Secondary | ICD-10-CM | POA: Diagnosis not present

## 2022-09-13 DIAGNOSIS — J328 Other chronic sinusitis: Secondary | ICD-10-CM | POA: Diagnosis not present

## 2022-09-13 DIAGNOSIS — J320 Chronic maxillary sinusitis: Secondary | ICD-10-CM | POA: Diagnosis not present

## 2022-09-13 DIAGNOSIS — J321 Chronic frontal sinusitis: Secondary | ICD-10-CM | POA: Diagnosis not present

## 2022-09-13 DIAGNOSIS — J322 Chronic ethmoidal sinusitis: Secondary | ICD-10-CM | POA: Diagnosis not present

## 2022-09-13 HISTORY — PX: SINUS ENDO WITH FUSION: SHX5329

## 2022-09-13 HISTORY — PX: FRONTAL SINUS EXPLORATION: SHX6591

## 2022-09-13 HISTORY — PX: ETHMOIDECTOMY: SHX5197

## 2022-09-13 HISTORY — PX: NASAL SEPTOPLASTY W/ TURBINOPLASTY: SHX2070

## 2022-09-13 HISTORY — PX: SPHENOIDECTOMY: SHX2421

## 2022-09-13 HISTORY — PX: MAXILLARY ANTROSTOMY: SHX2003

## 2022-09-13 LAB — GLUCOSE, CAPILLARY
Glucose-Capillary: 134 mg/dL — ABNORMAL HIGH (ref 70–99)
Glucose-Capillary: 135 mg/dL — ABNORMAL HIGH (ref 70–99)

## 2022-09-13 SURGERY — SEPTOPLASTY, NOSE, WITH NASAL TURBINATE REDUCTION
Anesthesia: General | Site: Nose | Laterality: Left

## 2022-09-13 MED ORDER — CHLORHEXIDINE GLUCONATE 0.12 % MT SOLN: 15.0000 mL | Freq: Once | OROMUCOSAL | Status: AC

## 2022-09-13 MED ORDER — LIDOCAINE-EPINEPHRINE 1 %-1:100000 IJ SOLN
INTRAMUSCULAR | Status: DC | PRN
  Administered 2022-09-13: 5 mL

## 2022-09-13 MED ORDER — SUFENTANIL CITRATE 50 MCG/ML IV SOLN
INTRAVENOUS | Status: DC | PRN
  Administered 2022-09-13: 10 ug via INTRAVENOUS
  Administered 2022-09-13: 5 ug via INTRAVENOUS

## 2022-09-13 MED ORDER — PROPOFOL 10 MG/ML IV BOLUS
INTRAVENOUS | Status: AC
  Filled 2022-09-13: qty 20

## 2022-09-13 MED ORDER — SCOPOLAMINE 1 MG/3DAYS TD PT72
MEDICATED_PATCH | TRANSDERMAL | Status: AC
  Administered 2022-09-13: 1.5 mg via TRANSDERMAL
  Filled 2022-09-13: qty 1

## 2022-09-13 MED ORDER — SUFENTANIL CITRATE 50 MCG/ML IV SOLN
INTRAVENOUS | Status: AC
  Filled 2022-09-13: qty 1

## 2022-09-13 MED ORDER — FENTANYL CITRATE (PF) 250 MCG/5ML IJ SOLN
INTRAMUSCULAR | Status: AC
  Filled 2022-09-13: qty 5

## 2022-09-13 MED ORDER — DEXMEDETOMIDINE (PRECEDEX) IN NS 20 MCG/5ML (4 MCG/ML) IV SYRINGE
PREFILLED_SYRINGE | INTRAVENOUS | Status: DC | PRN
  Administered 2022-09-13: 8 ug via INTRAVENOUS

## 2022-09-13 MED ORDER — 0.9 % SODIUM CHLORIDE (POUR BTL) OPTIME
TOPICAL | Status: DC | PRN
  Administered 2022-09-13: 1000 mL

## 2022-09-13 MED ORDER — BACITRACIN ZINC 500 UNIT/GM EX OINT
TOPICAL_OINTMENT | CUTANEOUS | Status: AC
  Filled 2022-09-13: qty 28.35

## 2022-09-13 MED ORDER — ACETAMINOPHEN 500 MG PO TABS: 1000.0000 mg | ORAL_TABLET | Freq: Once | ORAL | Status: AC

## 2022-09-13 MED ORDER — SUCCINYLCHOLINE 20MG/ML (10ML) SYRINGE FOR MEDFUSION PUMP - OPTIME
INTRAMUSCULAR | Status: DC | PRN
  Administered 2022-09-13: 120 mg via INTRAVENOUS

## 2022-09-13 MED ORDER — MIDAZOLAM HCL 2 MG/2ML IJ SOLN
INTRAMUSCULAR | Status: DC | PRN
  Administered 2022-09-13: 2 mg via INTRAVENOUS

## 2022-09-13 MED ORDER — AMOXICILLIN-POT CLAVULANATE 875-125 MG PO TABS: 1.0000 | ORAL_TABLET | Freq: Two times a day (BID) | ORAL | 0 refills | Status: AC

## 2022-09-13 MED ORDER — SCOPOLAMINE 1 MG/3DAYS TD PT72: 1.0000 | MEDICATED_PATCH | TRANSDERMAL | Status: DC

## 2022-09-13 MED ORDER — DEXTROSE 5 % IV SOLN
INTRAVENOUS | Status: DC | PRN
  Administered 2022-09-13: 3 g via INTRAVENOUS

## 2022-09-13 MED ORDER — FENTANYL CITRATE (PF) 100 MCG/2ML IJ SOLN: 25.0000 ug | INTRAMUSCULAR | Status: DC | PRN

## 2022-09-13 MED ORDER — LACTATED RINGERS IV SOLN: INTRAVENOUS | Status: DC

## 2022-09-13 MED ORDER — OXYCODONE-ACETAMINOPHEN 5-325 MG PO TABS: 1.0000 | ORAL_TABLET | ORAL | 0 refills | Status: AC | PRN

## 2022-09-13 MED ORDER — PROMETHAZINE HCL 25 MG/ML IJ SOLN: 6.2500 mg | INTRAMUSCULAR | Status: DC | PRN

## 2022-09-13 MED ORDER — LIDOCAINE-EPINEPHRINE 1 %-1:100000 IJ SOLN
INTRAMUSCULAR | Status: AC
  Filled 2022-09-13: qty 1

## 2022-09-13 MED ORDER — LIDOCAINE HCL (CARDIAC) PF 100 MG/5ML IV SOSY
PREFILLED_SYRINGE | INTRAVENOUS | Status: DC | PRN
  Administered 2022-09-13: 100 mg via INTRAVENOUS

## 2022-09-13 MED ORDER — BACITRACIN ZINC 500 UNIT/GM EX OINT
TOPICAL_OINTMENT | CUTANEOUS | Status: DC | PRN
  Administered 2022-09-13: 1 via TOPICAL

## 2022-09-13 MED ORDER — CHLORHEXIDINE GLUCONATE 0.12 % MT SOLN
OROMUCOSAL | Status: AC
  Administered 2022-09-13: 15 mL via OROMUCOSAL
  Filled 2022-09-13: qty 15

## 2022-09-13 MED ORDER — INSULIN ASPART 100 UNIT/ML IJ SOLN: 0.0000 [IU] | INTRAMUSCULAR | Status: DC | PRN

## 2022-09-13 MED ORDER — LIDOCAINE HCL 1 % IJ SOLN
INTRAMUSCULAR | Status: AC
  Filled 2022-09-13: qty 20

## 2022-09-13 MED ORDER — ORAL CARE MOUTH RINSE: 15.0000 mL | Freq: Once | OROMUCOSAL | Status: AC

## 2022-09-13 MED ORDER — OXYMETAZOLINE HCL 0.05 % NA SOLN
NASAL | Status: DC | PRN
  Administered 2022-09-13: 1 via TOPICAL

## 2022-09-13 MED ORDER — SODIUM CHLORIDE 0.9 % IR SOLN
Status: DC | PRN
  Administered 2022-09-13: 1000 mL

## 2022-09-13 MED ORDER — MIDAZOLAM HCL 2 MG/2ML IJ SOLN
INTRAMUSCULAR | Status: AC
  Filled 2022-09-13: qty 2

## 2022-09-13 MED ORDER — OXYMETAZOLINE HCL 0.05 % NA SOLN
NASAL | Status: AC
  Filled 2022-09-13: qty 30

## 2022-09-13 MED ORDER — PROPOFOL 10 MG/ML IV BOLUS
INTRAVENOUS | Status: DC | PRN
  Administered 2022-09-13: 170 mg via INTRAVENOUS

## 2022-09-13 MED ORDER — ACETAMINOPHEN 500 MG PO TABS
ORAL_TABLET | ORAL | Status: AC
  Administered 2022-09-13: 1000 mg via ORAL
  Filled 2022-09-13: qty 2

## 2022-09-13 MED ORDER — ROCURONIUM 10MG/ML (10ML) SYRINGE FOR MEDFUSION PUMP - OPTIME
INTRAVENOUS | Status: DC | PRN
  Administered 2022-09-13 (×2): 50 mg via INTRAVENOUS

## 2022-09-13 MED ORDER — SUGAMMADEX SODIUM 500 MG/5ML IV SOLN
INTRAVENOUS | Status: DC | PRN
  Administered 2022-09-13: 500 mg via INTRAVENOUS

## 2022-09-13 MED ORDER — AMISULPRIDE (ANTIEMETIC) 5 MG/2ML IV SOLN: 10.0000 mg | Freq: Once | INTRAVENOUS | Status: DC | PRN

## 2022-09-13 MED ORDER — BACITRACIN-NEOMYCIN-POLYMYXIN OINTMENT TUBE
TOPICAL_OINTMENT | CUTANEOUS | Status: AC
  Filled 2022-09-13: qty 14.17

## 2022-09-13 MED ORDER — LACTATED RINGERS IV SOLN: INTRAVENOUS | Status: DC | PRN

## 2022-09-13 SURGICAL SUPPLY — 53 items
BAG COUNTER SPONGE SURGICOUNT (BAG) ×2 IMPLANT
BLADE RAD40 ROTATE 4M 4 5PK (BLADE) IMPLANT
BLADE RAD60 ROTATE M4 4 5PK (BLADE) IMPLANT
BLADE ROTATE TRICUT 4X13 M4 (BLADE) ×2 IMPLANT
BLADE SURG 15 STRL LF DISP TIS (BLADE) IMPLANT
BLADE SURG 15 STRL SS (BLADE)
BLADE TRICUT ROTATE M4 4 5PK (BLADE) IMPLANT
CANISTER SUCT 3000ML PPV (MISCELLANEOUS) ×2 IMPLANT
CNTNR URN SCR LID CUP LEK RST (MISCELLANEOUS) ×2 IMPLANT
COAGULATOR SUCT SWTCH 10FR 6 (ELECTROSURGICAL) ×2 IMPLANT
CONT SPEC 4OZ STRL OR WHT (MISCELLANEOUS) ×2
COVER SURGICAL LIGHT HANDLE (MISCELLANEOUS) IMPLANT
DRAPE HALF SHEET 40X57 (DRAPES) IMPLANT
DRAPE SHEET LG 3/4 BI-LAMINATE (DRAPES) IMPLANT
DRSG NASOPORE 8CM (GAUZE/BANDAGES/DRESSINGS) IMPLANT
ELECT COATED BLADE 2.86 ST (ELECTRODE) IMPLANT
ELECT REM PT RETURN 9FT ADLT (ELECTROSURGICAL) ×2
ELECTRODE REM PT RTRN 9FT ADLT (ELECTROSURGICAL) ×2 IMPLANT
FILTER ARTHROSCOPY CONVERTOR (FILTER) ×2 IMPLANT
GAUZE 4X4 16PLY ~~LOC~~+RFID DBL (SPONGE) ×2 IMPLANT
GAUZE SPONGE 2X2 8PLY STRL LF (GAUZE/BANDAGES/DRESSINGS) ×2 IMPLANT
GLOVE ECLIPSE 7.5 STRL STRAW (GLOVE) ×2 IMPLANT
GOWN STRL REUS W/ TWL LRG LVL3 (GOWN DISPOSABLE) ×4 IMPLANT
GOWN STRL REUS W/TWL LRG LVL3 (GOWN DISPOSABLE) ×4
KIT BASIN OR (CUSTOM PROCEDURE TRAY) ×2 IMPLANT
KIT TURNOVER KIT B (KITS) ×2 IMPLANT
NDL HYPO 25GX1X1/2 BEV (NEEDLE) ×2 IMPLANT
NDL SPNL 25GX3.5 QUINCKE BL (NEEDLE) ×2 IMPLANT
NEEDLE HYPO 25GX1X1/2 BEV (NEEDLE) ×2 IMPLANT
NEEDLE SPNL 25GX3.5 QUINCKE BL (NEEDLE) ×2 IMPLANT
NS IRRIG 1000ML POUR BTL (IV SOLUTION) ×2 IMPLANT
PAD ARMBOARD 7.5X6 YLW CONV (MISCELLANEOUS) ×4 IMPLANT
PENCIL SMOKE EVACUATOR (MISCELLANEOUS) ×2 IMPLANT
POSITIONER HEAD DONUT 9IN (MISCELLANEOUS) ×2 IMPLANT
SOL ANTI FOG 6CC (MISCELLANEOUS) IMPLANT
SOLUTION ANTI FOG 6CC (MISCELLANEOUS)
SPLINT NASAL DOYLE BI-VL (GAUZE/BANDAGES/DRESSINGS) ×2 IMPLANT
SPONGE NEURO XRAY DETECT 1X3 (DISPOSABLE) ×2 IMPLANT
SURGIFLO W/THROMBIN 8M KIT (HEMOSTASIS) IMPLANT
SUT CHROMIC 4 0 SH 27 (SUTURE) ×2 IMPLANT
SUT PLAIN 4 0 ~~LOC~~ 1 (SUTURE) ×2 IMPLANT
SUT PROLENE 3 0 PS 2 (SUTURE) ×2 IMPLANT
SYR CONTROL 10ML LL (SYRINGE) IMPLANT
TOWEL GREEN STERILE FF (TOWEL DISPOSABLE) ×2 IMPLANT
TRACKER ENT INSTRUMENT (MISCELLANEOUS) ×2 IMPLANT
TRACKER ENT PATIENT (MISCELLANEOUS) ×2 IMPLANT
TRAY ENT MC OR (CUSTOM PROCEDURE TRAY) ×2 IMPLANT
TUBE CONNECTING 12X1/4 (SUCTIONS) ×2 IMPLANT
TUBE SALEM SUMP 16 FR W/ARV (TUBING) ×2 IMPLANT
TUBING EXTENTION W/L.L. (IV SETS) ×2 IMPLANT
TUBING STRAIGHTSHOT EPS 5PK (TUBING) ×2 IMPLANT
WATER STERILE IRR 1000ML POUR (IV SOLUTION) ×2 IMPLANT
YANKAUER SUCT BULB TIP NO VENT (SUCTIONS) ×2 IMPLANT

## 2022-09-13 NOTE — Transfer of Care (Signed)
Immediate Anesthesia Transfer of Care Note  Patient: Robin Durham  Procedure(s) Performed: NASAL SEPTOPLASTY WITH TURBINATE REDUCTION (Bilateral: Nose) LEFT MAXILLARY ANTROSTOMY WITH TISSUE REMOVAL (Left: Nose) LEFT FRONTAL SINUS EXPLORATION (Left: Nose) LEFT ETHMOIDECTOMY (Left: Nose) LEFT SPHENOIDECTOMY (Left: Nose) SINUS ENDO WITH FUSION (Left: Nose)  Patient Location: PACU  Anesthesia Type:General  Level of Consciousness: drowsy  Airway & Oxygen Therapy: Patient Spontanous Breathing  Post-op Assessment: Report given to RN and Post -op Vital signs reviewed and stable  Post vital signs: Reviewed and stable  Last Vitals:  Vitals Value Taken Time  BP    Temp    Pulse 81 09/13/22 1546  Resp 21 09/13/22 1546  SpO2 95 % 09/13/22 1546  Vitals shown include unvalidated device data.  Last Pain:  Vitals:   09/13/22 1208  TempSrc:   PainSc: 0-No pain      Patients Stated Pain Goal: 0 (37/34/28 7681)  Complications: No notable events documented.

## 2022-09-13 NOTE — Anesthesia Procedure Notes (Signed)
Procedure Name: Intubation Date/Time: 09/13/2022 1:59 PM  Performed by: Claris Che, CRNAPre-anesthesia Checklist: Patient identified, Emergency Drugs available, Suction available, Patient being monitored and Timeout performed Patient Re-evaluated:Patient Re-evaluated prior to induction Oxygen Delivery Method: Circle system utilized Preoxygenation: Pre-oxygenation with 100% oxygen Induction Type: IV induction, Rapid sequence and Cricoid Pressure applied Ventilation: Mask ventilation without difficulty Laryngoscope Size: Mac and 4 Grade View: Grade II Tube type: Oral Tube size: 7.5 mm Number of attempts: 1 Airway Equipment and Method: Stylet Placement Confirmation: ETT inserted through vocal cords under direct vision, positive ETCO2 and breath sounds checked- equal and bilateral Secured at: 23 cm Tube secured with: Tape Dental Injury: Teeth and Oropharynx as per pre-operative assessment

## 2022-09-13 NOTE — Discharge Instructions (Addendum)

## 2022-09-13 NOTE — H&P (Signed)
Cc: Recurrent rhinosinusitis, chronic nasal obstruction  HPI: The patient is a 66 year old female who returns today for her follow-up evaluation.  The patient has a history of recurrent rhinosinusitis and chronic nasal obstruction.  At her last visit 1 month ago, she was noted to have persistent mucopurulent drainage from the left middle meatus.  She was also noted to have nasal mucosal congestion, nasal septal deviation, and bilateral inferior turbinate hypertrophy.  She was treated with extended course of Augmentin, prednisone Dosepak, and Flonase nasal spray.  She also underwent a sinus CT scan.  The CT showed persistent mucosal disease within the left maxillary, ethmoid, frontal, and sphenoid sinuses.  The patient returns today complaining of persistent facial pressure and discomfort.  She still has significant difficulty breathing through her nose.  She is interested in more definitive treatment.  Exam: General: Communicates without difficulty, well nourished, no acute distress. Head: Normocephalic, no evidence injury, no tenderness, facial buttresses intact without stepoff. Eyes: PERRL, EOMI. No scleral icterus, conjunctivae clear. Neuro: CN II exam reveals vision grossly intact.  No nystagmus at any point of gaze. Ears: Auricles well formed without lesions.  Ear canals are intact without mass or lesion.  No erythema or edema is appreciated.  The TMs are intact without fluid. Nose: External evaluation reveals normal support and skin without lesions.  Dorsum is intact.  Anterior rhinoscopy reveals congested mucosa over anterior aspect of inferior turbinates and intact septum.  No purulence noted. Oral:  Oral cavity and oropharynx are intact, symmetric, without erythema or edema.  Mucosa is moist without lesions. Neck: Full range of motion without pain.  There is no significant lymphadenopathy.  No masses palpable.  Thyroid bed within normal limits to palpation.  Parotid glands and submandibular glands  equal bilaterally without mass.  Trachea is midline. Neuro:  CN 2-12 grossly intact. Gait normal. Vestibular: No nystagmus at any point of gaze. The cerebellar examination is unremarkable. A flexible scope was inserted into the right nasal cavity. Endoscopy of the interior nasal cavity, superior, inferior, and middle meatus was performed. The sphenoid-ethmoid recess was examined. Edematous mucosa was noted. No polyp, mass, or lesion was appreciated. Nasal septal deviation noted. Olfactory cleft was clear. Nasopharynx was clear. Turbinates were hypertrophied but without mass. The procedure was repeated on the contralateral side with similar findings. Mucopurulent drainage was noted from the middle meatus. The patient tolerated the procedure well.  Assessment: 1.  Chronic left pansinusitis, involving all paranasal sinuses.  The patient was previously treated with multiple courses of extended antibiotics and steroids. 2.  Chronic nasal obstruction, secondary to nasal mucosal congestion, nasal septal deviation, and bilateral inferior turbinate hypertrophy.  Polypoid mucosa was noted to obstruct the middle meatus.  Plan: 1.  The nasal endoscopy findings and the CT images are reviewed with the patient. 2.  Continue with Flonase nasal spray and nasal saline irrigation. 3.  In light of her persistent symptoms, she will benefit from undergoing surgical intervention with left sided endoscopic sinus surgery, septoplasty, and turbinate reduction. 4.  The risk, benefits, alternatives, and details of the procedures are reviewed.  Questions are invited and answered. 5.  The patient would like to proceed with the procedures.

## 2022-09-13 NOTE — Op Note (Signed)
DATE OF PROCEDURE: 09/13/2022  OPERATIVE REPORT   SURGEON: Newman Pies, MD   PREOPERATIVE DIAGNOSES:  1.  Severe nasal septal deviation.  2.  Bilateral inferior turbinate hypertrophy.  3.  Chronic left maxillary, ethmoid, frontal, and sphenoid sinusitis/polyposis.  POSTOPERATIVE DIAGNOSES:  1.  Severe nasal septal deviation.  2.  Bilateral inferior turbinate hypertrophy.  3.  Chronic left maxillary, ethmoid, frontal, and sphenoid sinusitis/polyposis. 4.  Left sinus fungus ball.  PROCEDURE PERFORMED:  1.  Septoplasty.  2.  Bilateral partial inferior turbinate resection.  3.  Left endoscopic total ethmoidectomy and sphenoidotomy. 4.  Left endoscopic frontal sinusotomy with polyp removal. 5.  Left endoscopic maxillary antrostomy with polyp and fungus ball removal. 6.  FUSION stereotactic image guidance.  ANESTHESIA: General endotracheal tube anesthesia.   COMPLICATIONS: None.   ESTIMATED BLOOD LOSS: 200 mL.   INDICATION FOR PROCEDURE: Robin Durham is a 66 y.o. female with a history of chronic nasal obstruction and chronic left sided pansinusitis. The patient was treated with multiple courses of extended antibiotics, prednisone, antihistamine, decongestant, and steroid nasal sprays. However, the patient continued to be symptomatic. On examination, the patient was noted to have bilateral severe inferior turbinate hypertrophy and significant nasal septal deviation, causing significant nasal obstruction.  Mucopurulent drainage was noted from the left sinus openings.  Her CT scan showed chronic left-sided pansinusitis with complete opacifications.  Based on the above findings, the decision was made for the patient to undergo the above-stated procedures. The risks, benefits, alternatives, and details of the procedures were discussed with the patient. Questions were invited and answered. Informed consent was obtained.   DESCRIPTION OF PROCEDURE: The patient was taken to the operating room and  placed supine on the operating table. General endotracheal tube anesthesia was administered by the anesthesiologist. The patient was positioned, and prepped and draped in the standard fashion for nasal surgery. Pledgets soaked with Afrin were placed in both nasal cavities for decongestion. The pledgets were subsequently removed. The FUSION stereotactic image guidance marker was placed. The image guidance system was functional throughout the case.  Examination of the nasal cavity revealed a severe nasal septal deviation. 1% lidocaine with 1:100,000 epinephrine was injected onto the nasal septum bilaterally. A hemitransfixion incision was made on the left side. The mucosal flap was carefully elevated on the left side. A cartilaginous incision was made 1 cm superior to the caudal margin of the nasal septum. Mucosal flap was also elevated on the right side in the similar fashion. It should be noted that due to the severe septal deviation, the deviated portion of the cartilaginous and bony septum had to be removed in piecemeal fashion. Once the deviated portions were removed, a straight midline septum was achieved. The septum was then quilted with 4-0 plain gut sutures. The hemitransfixion incision was closed with interrupted 4-0 chromic sutures.   The inferior one half of both hypertrophied inferior turbinate was crossclamped with a Kelly clamp. The inferior one half of each inferior turbinate was then resected with a pair of cross cutting scissors. Hemostasis was achieved with a suction cautery device.   Using a 0 endoscope, the left nasal cavity was examined. A large concha bullosa was noted. Using Tru-Cut forceps, the inferior one third and medial one half of the concha bullosa was resected.  Purulent drainage and polypoid tissue were noted within the middle meatus. The polypoid tissue was removed using a combination of microdebrider and Blakesley forceps. The uncinate process was resected with a freer  elevator.  The maxillary antrum was entered and enlarged using a combination of backbiter and microdebrider.  A large amount of fungus ball and purulent secretion were removed from the left maxillary sinus.  Attention was then focused on the ethmoid sinuses. The bony partitions of the anterior and posterior ethmoid cavities were taken down. Polypoid tissue and fungus debris were noted and removed.  More polyps were noted to obstruct the left sphenoid opening. The polyps were removed. The sphenoid opening was entered and enlarged. No pathology was noted within the sphenoid sinus. Attention was then focused on the frontal sinus. The frontal recess was identified and enlarged by removing the surrounding bony partitions. Polypoid tissue was removed from the frontal recess. All 4 paranasal sinuses were copiously irrigated with saline solution.  Doyle splints were applied to the nasal septum.  The care of the patient was turned over to the anesthesiologist. The patient was awakened from anesthesia without difficulty. The patient was extubated and transferred to the recovery room in good condition.   OPERATIVE FINDINGS: Severe nasal septal deviation and bilateral inferior turbinate hypertrophy.  Chronic left-sided pansinusitis with polyposis and fungus balls.  SPECIMEN: Left-sided sinus contents.   FOLLOWUP CARE: The patient be discharged home once she is awake and alert. The patient will be placed on Percocet p.r.n. pain, and Augmentin 875 mg p.o. b.i.d. for 5 days. The patient will follow up in my office in 3 days for splint removal.   [name] [name], MD

## 2022-09-15 NOTE — Anesthesia Postprocedure Evaluation (Addendum)
Anesthesia Post Note  Patient: Robin Durham  Procedure(s) Performed: NASAL SEPTOPLASTY WITH TURBINATE REDUCTION (Bilateral: Nose) LEFT MAXILLARY ANTROSTOMY WITH TISSUE REMOVAL (Left: Nose) LEFT FRONTAL SINUS EXPLORATION (Left: Nose) LEFT ETHMOIDECTOMY (Left: Nose) LEFT SPHENOIDECTOMY (Left: Nose) SINUS ENDO WITH FUSION (Left: Nose)     Patient location during evaluation: PACU Anesthesia Type: General Level of consciousness: sedated Pain management: pain level controlled Vital Signs Assessment: post-procedure vital signs reviewed and stable Respiratory status: spontaneous breathing and respiratory function stable Cardiovascular status: stable Postop Assessment: no apparent nausea or vomiting Anesthetic complications: no   No notable events documented.  Last Vitals:  Vitals:   09/13/22 1600 09/13/22 1615  BP: (!) 152/88 (!) 160/86  Pulse: 74 74  Resp: 16 14  Temp:    SpO2: 94% 95%    Last Pain:  Vitals:   09/13/22 1615  TempSrc:   PainSc: 0-No pain                 Gennie Dib DANIEL

## 2022-09-15 NOTE — Addendum Note (Signed)
Addendum  created 09/15/22 0751 by Duane Boston, MD   Clinical Note Signed

## 2022-09-16 ENCOUNTER — Encounter (HOSPITAL_COMMUNITY): Payer: Self-pay | Admitting: Otolaryngology

## 2022-09-16 DIAGNOSIS — J338 Other polyp of sinus: Secondary | ICD-10-CM | POA: Diagnosis not present

## 2022-09-16 DIAGNOSIS — J324 Chronic pansinusitis: Secondary | ICD-10-CM | POA: Diagnosis not present

## 2022-09-16 LAB — SURGICAL PATHOLOGY

## 2022-10-09 DIAGNOSIS — J338 Other polyp of sinus: Secondary | ICD-10-CM | POA: Diagnosis not present

## 2022-10-09 DIAGNOSIS — J324 Chronic pansinusitis: Secondary | ICD-10-CM | POA: Diagnosis not present

## 2022-10-30 DIAGNOSIS — J338 Other polyp of sinus: Secondary | ICD-10-CM | POA: Diagnosis not present

## 2022-10-30 DIAGNOSIS — J324 Chronic pansinusitis: Secondary | ICD-10-CM | POA: Diagnosis not present

## 2022-11-04 DIAGNOSIS — M5136 Other intervertebral disc degeneration, lumbar region: Secondary | ICD-10-CM | POA: Diagnosis not present

## 2022-11-04 DIAGNOSIS — E7849 Other hyperlipidemia: Secondary | ICD-10-CM | POA: Diagnosis not present

## 2022-11-04 DIAGNOSIS — I10 Essential (primary) hypertension: Secondary | ICD-10-CM | POA: Diagnosis not present

## 2022-11-04 DIAGNOSIS — E782 Mixed hyperlipidemia: Secondary | ICD-10-CM | POA: Diagnosis not present

## 2022-11-04 DIAGNOSIS — I2510 Atherosclerotic heart disease of native coronary artery without angina pectoris: Secondary | ICD-10-CM | POA: Diagnosis not present

## 2022-11-04 DIAGNOSIS — M1991 Primary osteoarthritis, unspecified site: Secondary | ICD-10-CM | POA: Diagnosis not present

## 2022-11-04 DIAGNOSIS — E118 Type 2 diabetes mellitus with unspecified complications: Secondary | ICD-10-CM | POA: Diagnosis not present

## 2023-01-15 DIAGNOSIS — H2512 Age-related nuclear cataract, left eye: Secondary | ICD-10-CM | POA: Diagnosis not present

## 2023-01-15 DIAGNOSIS — H25811 Combined forms of age-related cataract, right eye: Secondary | ICD-10-CM | POA: Diagnosis not present

## 2023-01-15 DIAGNOSIS — H25812 Combined forms of age-related cataract, left eye: Secondary | ICD-10-CM | POA: Diagnosis not present

## 2023-02-25 DIAGNOSIS — R6889 Other general symptoms and signs: Secondary | ICD-10-CM | POA: Diagnosis not present

## 2023-02-25 DIAGNOSIS — I10 Essential (primary) hypertension: Secondary | ICD-10-CM | POA: Diagnosis not present

## 2023-02-25 DIAGNOSIS — E1159 Type 2 diabetes mellitus with other circulatory complications: Secondary | ICD-10-CM | POA: Diagnosis not present

## 2023-02-25 DIAGNOSIS — J069 Acute upper respiratory infection, unspecified: Secondary | ICD-10-CM | POA: Diagnosis not present

## 2023-02-25 DIAGNOSIS — I2510 Atherosclerotic heart disease of native coronary artery without angina pectoris: Secondary | ICD-10-CM | POA: Diagnosis not present

## 2023-03-25 ENCOUNTER — Other Ambulatory Visit (HOSPITAL_COMMUNITY): Payer: Self-pay | Admitting: Family Medicine

## 2023-03-25 ENCOUNTER — Ambulatory Visit (HOSPITAL_COMMUNITY)
Admission: RE | Admit: 2023-03-25 | Discharge: 2023-03-25 | Disposition: A | Discharge status: Home / Self Care | Payer: Medicare Other | Source: Ambulatory Visit | Attending: Family Medicine | Admitting: Family Medicine

## 2023-03-25 DIAGNOSIS — E782 Mixed hyperlipidemia: Secondary | ICD-10-CM | POA: Diagnosis not present

## 2023-03-25 DIAGNOSIS — E1159 Type 2 diabetes mellitus with other circulatory complications: Secondary | ICD-10-CM | POA: Diagnosis not present

## 2023-03-25 DIAGNOSIS — I2510 Atherosclerotic heart disease of native coronary artery without angina pectoris: Secondary | ICD-10-CM | POA: Diagnosis not present

## 2023-03-25 DIAGNOSIS — R059 Cough, unspecified: Secondary | ICD-10-CM

## 2023-03-25 DIAGNOSIS — Z0001 Encounter for general adult medical examination with abnormal findings: Secondary | ICD-10-CM | POA: Diagnosis not present

## 2023-03-25 DIAGNOSIS — M1991 Primary osteoarthritis, unspecified site: Secondary | ICD-10-CM | POA: Diagnosis not present

## 2023-03-25 DIAGNOSIS — I10 Essential (primary) hypertension: Secondary | ICD-10-CM | POA: Diagnosis not present

## 2023-03-27 ENCOUNTER — Encounter: Payer: Self-pay | Admitting: Family Medicine

## 2023-03-27 ENCOUNTER — Other Ambulatory Visit (HOSPITAL_COMMUNITY): Payer: Self-pay | Admitting: Family Medicine

## 2023-03-27 DIAGNOSIS — R9389 Abnormal findings on diagnostic imaging of other specified body structures: Secondary | ICD-10-CM

## 2023-04-10 ENCOUNTER — Ambulatory Visit (HOSPITAL_COMMUNITY)
Admission: RE | Admit: 2023-04-10 | Discharge: 2023-04-10 | Disposition: A | Discharge status: Home / Self Care | Payer: Medicare Other | Source: Ambulatory Visit | Attending: Family Medicine | Admitting: Family Medicine

## 2023-04-10 DIAGNOSIS — R9389 Abnormal findings on diagnostic imaging of other specified body structures: Secondary | ICD-10-CM | POA: Insufficient documentation

## 2023-05-19 DIAGNOSIS — I5042 Chronic combined systolic (congestive) and diastolic (congestive) heart failure: Secondary | ICD-10-CM | POA: Diagnosis not present

## 2023-05-19 DIAGNOSIS — I700 Atherosclerosis of aorta: Secondary | ICD-10-CM | POA: Diagnosis not present

## 2023-05-19 DIAGNOSIS — E785 Hyperlipidemia, unspecified: Secondary | ICD-10-CM | POA: Diagnosis not present

## 2023-05-19 DIAGNOSIS — I509 Heart failure, unspecified: Secondary | ICD-10-CM | POA: Diagnosis not present

## 2023-05-19 DIAGNOSIS — J302 Other seasonal allergic rhinitis: Secondary | ICD-10-CM | POA: Diagnosis not present

## 2023-05-19 DIAGNOSIS — I110 Hypertensive heart disease with heart failure: Secondary | ICD-10-CM | POA: Diagnosis not present

## 2023-05-19 DIAGNOSIS — E889 Metabolic disorder, unspecified: Secondary | ICD-10-CM | POA: Diagnosis not present

## 2023-05-19 DIAGNOSIS — I10 Essential (primary) hypertension: Secondary | ICD-10-CM | POA: Diagnosis not present

## 2023-05-19 DIAGNOSIS — E119 Type 2 diabetes mellitus without complications: Secondary | ICD-10-CM | POA: Diagnosis not present

## 2023-06-09 DIAGNOSIS — Z7189 Other specified counseling: Secondary | ICD-10-CM | POA: Diagnosis not present

## 2023-06-09 DIAGNOSIS — Z0001 Encounter for general adult medical examination with abnormal findings: Secondary | ICD-10-CM | POA: Diagnosis not present

## 2023-06-09 DIAGNOSIS — I700 Atherosclerosis of aorta: Secondary | ICD-10-CM | POA: Diagnosis not present

## 2023-06-09 DIAGNOSIS — Z79899 Other long term (current) drug therapy: Secondary | ICD-10-CM | POA: Diagnosis not present

## 2023-06-09 DIAGNOSIS — E785 Hyperlipidemia, unspecified: Secondary | ICD-10-CM | POA: Diagnosis not present

## 2023-06-09 DIAGNOSIS — Z1211 Encounter for screening for malignant neoplasm of colon: Secondary | ICD-10-CM | POA: Diagnosis not present

## 2023-06-09 DIAGNOSIS — E1151 Type 2 diabetes mellitus with diabetic peripheral angiopathy without gangrene: Secondary | ICD-10-CM | POA: Diagnosis not present

## 2023-06-18 DIAGNOSIS — Z780 Asymptomatic menopausal state: Secondary | ICD-10-CM | POA: Diagnosis not present

## 2023-06-20 ENCOUNTER — Other Ambulatory Visit: Payer: Self-pay | Admitting: Nurse Practitioner

## 2023-06-20 DIAGNOSIS — Z1231 Encounter for screening mammogram for malignant neoplasm of breast: Secondary | ICD-10-CM

## 2023-06-25 ENCOUNTER — Ambulatory Visit: Payer: PRIVATE HEALTH INSURANCE

## 2023-07-09 ENCOUNTER — Ambulatory Visit
Admission: RE | Admit: 2023-07-09 | Discharge: 2023-07-09 | Disposition: A | Discharge status: Home / Self Care | Payer: Medicare Other | Source: Ambulatory Visit | Attending: Nurse Practitioner | Admitting: Nurse Practitioner

## 2023-07-09 DIAGNOSIS — Z1231 Encounter for screening mammogram for malignant neoplasm of breast: Secondary | ICD-10-CM

## 2023-08-05 DIAGNOSIS — I5042 Chronic combined systolic (congestive) and diastolic (congestive) heart failure: Secondary | ICD-10-CM | POA: Diagnosis not present

## 2023-08-05 DIAGNOSIS — E785 Hyperlipidemia, unspecified: Secondary | ICD-10-CM | POA: Diagnosis not present

## 2023-08-05 DIAGNOSIS — R918 Other nonspecific abnormal finding of lung field: Secondary | ICD-10-CM | POA: Diagnosis not present

## 2023-08-05 DIAGNOSIS — I700 Atherosclerosis of aorta: Secondary | ICD-10-CM | POA: Diagnosis not present

## 2023-08-05 DIAGNOSIS — Z79899 Other long term (current) drug therapy: Secondary | ICD-10-CM | POA: Diagnosis not present

## 2023-08-05 DIAGNOSIS — I509 Heart failure, unspecified: Secondary | ICD-10-CM | POA: Diagnosis not present

## 2023-08-05 DIAGNOSIS — Z0000 Encounter for general adult medical examination without abnormal findings: Secondary | ICD-10-CM | POA: Diagnosis not present

## 2023-08-05 DIAGNOSIS — I110 Hypertensive heart disease with heart failure: Secondary | ICD-10-CM | POA: Diagnosis not present

## 2023-08-05 DIAGNOSIS — Z1159 Encounter for screening for other viral diseases: Secondary | ICD-10-CM | POA: Diagnosis not present

## 2023-08-06 ENCOUNTER — Other Ambulatory Visit: Payer: Self-pay | Admitting: Nurse Practitioner

## 2023-08-06 DIAGNOSIS — R918 Other nonspecific abnormal finding of lung field: Secondary | ICD-10-CM

## 2023-08-25 ENCOUNTER — Ambulatory Visit
Admission: RE | Admit: 2023-08-25 | Discharge: 2023-08-25 | Disposition: A | Discharge status: Home / Self Care | Payer: Medicare Other | Source: Ambulatory Visit | Attending: Nurse Practitioner | Admitting: Nurse Practitioner

## 2023-08-25 DIAGNOSIS — I2510 Atherosclerotic heart disease of native coronary artery without angina pectoris: Secondary | ICD-10-CM | POA: Diagnosis not present

## 2023-08-25 DIAGNOSIS — J984 Other disorders of lung: Secondary | ICD-10-CM | POA: Diagnosis not present

## 2023-08-25 DIAGNOSIS — R911 Solitary pulmonary nodule: Secondary | ICD-10-CM | POA: Diagnosis not present

## 2023-08-25 DIAGNOSIS — K449 Diaphragmatic hernia without obstruction or gangrene: Secondary | ICD-10-CM | POA: Diagnosis not present

## 2023-08-25 DIAGNOSIS — R918 Other nonspecific abnormal finding of lung field: Secondary | ICD-10-CM

## 2023-09-09 DIAGNOSIS — H9319 Tinnitus, unspecified ear: Secondary | ICD-10-CM | POA: Diagnosis not present

## 2023-09-09 DIAGNOSIS — E785 Hyperlipidemia, unspecified: Secondary | ICD-10-CM | POA: Diagnosis not present

## 2023-09-09 DIAGNOSIS — E1169 Type 2 diabetes mellitus with other specified complication: Secondary | ICD-10-CM | POA: Diagnosis not present

## 2023-09-09 DIAGNOSIS — I700 Atherosclerosis of aorta: Secondary | ICD-10-CM | POA: Diagnosis not present

## 2023-09-09 DIAGNOSIS — Z79899 Other long term (current) drug therapy: Secondary | ICD-10-CM | POA: Diagnosis not present

## 2024-03-19 ENCOUNTER — Other Ambulatory Visit: Payer: Self-pay | Admitting: Student

## 2024-03-19 DIAGNOSIS — E2839 Other primary ovarian failure: Secondary | ICD-10-CM
